# Patient Record
Sex: Female | Born: 1940 | Race: White | Hispanic: No | Marital: Married | Smoking: Former smoker
Health system: Southern US, Community
[De-identification: ages and names within clinical notes are randomized; demographics above are authoritative.]

## PROBLEM LIST (undated history)

## (undated) DIAGNOSIS — G5600 Carpal tunnel syndrome, unspecified upper limb: Secondary | ICD-10-CM

## (undated) DIAGNOSIS — G1221 Amyotrophic lateral sclerosis: Secondary | ICD-10-CM

## (undated) DIAGNOSIS — E039 Hypothyroidism, unspecified: Secondary | ICD-10-CM

## (undated) HISTORY — PX: OTHER SURGICAL HISTORY: SHX169

## (undated) HISTORY — DX: Carpal tunnel syndrome, unspecified upper limb: G56.00

## (undated) HISTORY — PX: TONSILLECTOMY: SUR1361

## (undated) HISTORY — DX: Hypothyroidism, unspecified: E03.9

---

## 1993-10-23 HISTORY — PX: APPENDECTOMY: SHX54

## 2019-10-24 HISTORY — PX: CARPAL TUNNEL RELEASE: SHX101

## 2019-10-24 HISTORY — PX: ELBOW SURGERY: SHX618

## 2021-02-24 ENCOUNTER — Other Ambulatory Visit: Payer: Self-pay

## 2021-02-24 ENCOUNTER — Telehealth: Payer: Self-pay | Admitting: Neurology

## 2021-02-24 ENCOUNTER — Encounter: Payer: Self-pay | Admitting: *Deleted

## 2021-02-24 ENCOUNTER — Ambulatory Visit (INDEPENDENT_AMBULATORY_CARE_PROVIDER_SITE_OTHER): Payer: Medicare Other | Admitting: Neurology

## 2021-02-24 ENCOUNTER — Encounter: Payer: Self-pay | Admitting: Neurology

## 2021-02-24 VITALS — BP 154/69 | HR 62 | Ht 61.5 in | Wt 153.8 lb

## 2021-02-24 DIAGNOSIS — G1220 Motor neuron disease, unspecified: Secondary | ICD-10-CM

## 2021-02-24 DIAGNOSIS — M62521 Muscle wasting and atrophy, not elsewhere classified, right upper arm: Secondary | ICD-10-CM

## 2021-02-24 DIAGNOSIS — R29898 Other symptoms and signs involving the musculoskeletal system: Secondary | ICD-10-CM | POA: Diagnosis not present

## 2021-02-24 DIAGNOSIS — E538 Deficiency of other specified B group vitamins: Secondary | ICD-10-CM

## 2021-02-24 NOTE — Progress Notes (Addendum)
[facility]    Provider:  Dr Jaynee Potts Requesting Provider: Erline Levine, MD Primary Care Provider:  Pcp, No  CC:  Right arm weakness  HPI:  Alexandria Potts is a 80 y.o. female here as requested by Alexandria Levine, MD for right arm weakness.  I reviewed Alexandria Potts notes: She is retired female who was referred for neurosurgical evaluation by her physical therapist Alexandria Potts due to complaints of progressively worsening right arm and hand weakness.  The patient stated "I cannot use my right hand and cannot pull my pantyhose on" the patient reported that she underwent a right ulnar and right median nerve decompression in January 2021 by Alexandria Potts at [facility].  Her symptoms continue to persist after undergoing decompression of the nerves.  At the 80-monthpostop visit she was referred to Dr. BRolena Infantefor evaluation of her cervical spine due to continued right hand weakness.  She was then sent for nerve conduction EMG and referred to physical therapy.  She reports that the nerve conduction test was unremarkable.  While undergoing physical therapy, the physical therapist suggested the patient be evaluated by Dr. SVertell Limberand that physical therapy would not be beneficial to her at this time.  She has also undergone a recent MRI of her right shoulder which was unremarkable.  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  However unusual that patient does not have pain, but her symptoms continue to progressively worsen, profound weakness on examination, imaging does not correlate with her symptoms and examination, Alexandria Potts her evaluated before considering any surgical procedure.  On examination  sensation was normal, gait and station appeared normal, her strength was intact other than 4 out of 5 weakness throughout the right arm deltoid biceps and triceps.  With increased DTRs bilaterally in both extremities, normal sensation C2-T1, cranial nerves normal, she did have a Hoffmann's on the right.  Started about 2 years ago. in January of 2020 - 2021 she went to [facility] and she was diagnosed with CTS and ulnar neuropathy. She Potts Dr. GAmedeo Plentyperform release surgery. But she did not improve after that. She Potts xrays/imaging of the arm. She was sent to Alexandria Potts repeat emg/ncs and MRI of the shoulder and MRI of the cervical spine. She is very active. She painted 2 rocking chairs with her left hand yesterday. She reports some "drawing" of her toes and some numbness and tingling in the toes. No sensory changes in the arm or hand. She has noticed wasting of the muscels of the right arm. Nothing on the left arm or legs. She has Potts some "catches" in her neck. Progressive weakness of the right arm and hand. She can't make a fist. She can;t make an O with hier forefinger and thumb. Actually worsened after the CTS and ulnar release surgery. No facial weakness, no swallowing problems, no tremors, Reviewed notes, labs and imaging from outside physicians, which showed: see above  Review of Systems: Patient complains of symptoms per HPI as well as the following symptoms weakness. Pertinent negatives and positives per HPI. All others negative.   Social History   Socioeconomic History  . Marital status: Married    Spouse name: Not on file  .  Number of children: Not on file  . Years of education: Not on file  . Highest education level: Not on file  Occupational History  . Not on file  Tobacco Use  . Smoking status: Former Smoker    Packs/day: 0.50    Years: 3.00    Pack years: 1.50  . Smokeless tobacco: Never Used  . Tobacco comment: in her early 68s  Vaping Use  . Vaping Use: Never  used  Substance and Sexual Activity  . Alcohol use: Never  . Drug use: Never  . Sexual activity: Not on file  Other Topics Concern  . Not on file  Social History Narrative   Lives at home with husband    Right handed   Caffeine: 1 cup/day   Social Determinants of Health   Financial Resource Strain: Not on file  Food Insecurity: Not on file  Transportation Needs: Not on file  Physical Activity: Not on file  Stress: Not on file  Social Connections: Not on file  Intimate Partner Violence: Not on file    Family History  Problem Relation Age of Onset  . Heart Problems Mother   . Other Mother        "lung problems"  . Heart Problems Father   . Other Father        "lung problems"  . Diabetes Other        both sides     Past Medical History:  Diagnosis Date  . Carpal tunnel syndrome    right  . Hypothyroidism     Patient Active Problem List   Diagnosis Date Noted  . Right arm weakness 03/08/2021    Past Surgical History:  Procedure Laterality Date  . APPENDECTOMY  1995  . CARPAL TUNNEL RELEASE Right 10/2019  . ELBOW SURGERY Right 10/2019  . HYSTERECTOMY    . TONSILLECTOMY     as a child    Current Outpatient Medications  Medication Sig Dispense Refill  . amLODipine (NORVASC) 5 MG tablet Take by mouth.    . fluticasone (FLONASE) 50 MCG/ACT nasal spray fluticasone propionate 50 mcg/actuation nasal spray,suspension    . latanoprost (XALATAN) 0.005 % ophthalmic solution     . levothyroxine (SYNTHROID) 75 MCG tablet levothyroxine 75 mcg tablet    . pioglitazone (ACTOS) 15 MG tablet Take 15 mg by mouth daily.     No current facility-administered medications for this visit.    Allergies as of 02/24/2021 - Review Complete 02/24/2021  Allergen Reaction Noted  . Penicillins  01/03/2021  . Statins  02/24/2021   Wasting of the interossei most pronounced in the right FDI. No facial weakness.   Vitals: BP (!) 154/69 (BP Location: Left Arm, Patient Position:  Sitting)   Pulse 62   Ht 5' 1.5" (1.562 m)   Wt 153 lb 12.8 oz (69.8 kg)   BMI 28.59 kg/m  Last Weight:  Wt Readings from Last 1 Encounters:  02/24/21 153 lb 12.8 oz (69.8 kg)   Last Height:   Ht Readings from Last 1 Encounters:  02/24/21 5' 1.5" (1.562 m)     Physical exam: Exam: Gen: NAD, conversant, well nourised,  well groomed                     CV: RRR, no MRG. No Carotid Bruits. No peripheral edema, warm, nontender Eyes: Conjunctivae clear without exudates or hemorrhage  Neuro: Detailed Neurologic Exam  Speech:    Speech is normal; fluent and spontaneous with  normal comprehension.  Cognition:    The patient is oriented to person, place, and time;     recent and remote memory intact;     language fluent;     normal attention, concentration,     fund of knowledge Cranial Nerves:    The pupils are equal, round, and reactive to light. Pupils too small to visualize fundi. Visual fields are full to finger confrontation. Extraocular movements are intact. Trigeminal sensation is intact and the muscles of mastication are normal. The face is symmetric. The palate elevates in the midline. Hearing intact. Voice is normal. Shoulder shrug is normal. The tongue has normal motion without fasciculations.   Coordination:    No dysmetria or ataxia noted on ftn/hts (cannt perform with right arm due to weakness)  Gait:   normal.   Motor Observation and strength  Right arm atrophy of the proximal and distal muscles biceps, triceps,FDI;  supraspinatus and infraspinatus appear not to be atrophied and there is no suprascapular winging, less so atrophy in the forearm.   Right biceps 1/5 Right bicep 3/5 Right tricep 3/5 Right Pronator/supinator 2+-3/5 (pronator slightly weaker) Right Wrist flexor more intact 4/5 but interossei 2/5, opponens 3/5 Right Wrist extensor 1/5 Right hand weakness predominantly ulnar (medial lumbricals) with a benediction sign left hand   lelft deltoid:  4/5 Left biceps 4+/5  Left triceps 4+/5 Left Pronator 4+/5 Left Supinator 3+-4/5 Left wrist extensor 2+/5 but flexor appears intact Slight weakness left opponens and left interossei       No asymmetry, no atrophy, and no involuntary movements noted. Tone:    Decreased right arm  Posture:    Posture is normal. normal erect       Sensation: intact to LT     Reflex Exam:  DTR's:  1+ bilateral Ajs,  3+ right patellar, 3+ left patellar,  left arm 2+-3+ Reflexes reduced right arm right trace triceps, right trace biceps, right trace to 1+ brachio    Toes:    The toes are downgoing bilaterally.   Clonus:    Clonus is absent.    Assessment/Plan:   80 y.o. female here as requested by Alexandria Levine, MD for right arm weakness. Ongoing or 5 years per patient s/p ulnar transposition and carpal tunnel release.  MRI of her right shoulder was completed without etiology.  Cervical MRI showed degenerative and arthritic changes multilevel on the right at C6-C7, bilaterally at the C5-C6 level, bilaterally at the C4-C5 level.  This is a patient with progressive right arm weakness.  She has proximal and distal atrophy of the right arm and weakness throughout which is more extensive than foraminal stenoses seen on her MRI cervical spine.  It is very unusual that she does not have any pain and that would raise a red flag for possible motor neuron disease. Some features support cervical radiculopathy as opposed to motor neuron disease including cervical multilevel arthritic and degenerative changes causing multilevel foraminal stenosis, ongoing for 2 years per patient,no significant fasciculations,  reflexes are reduced in the right arm(She does have hyperreflexia in the other limbs however). She does have mild weakness in the left arm however no atrophy, not symptomatic in the left arm, and strength in her lower extremities are normal; You would expect after 2+ years, if this were motor neuron disease,  patient would have far more symptoms.  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  Very unusual presentation.  No bulbar symptoms.   Emg/ncs: will repeat and compare to prior exams, 3 limbs (bilateral uppers and one lower) MRI of the brain and repeat MRI of the cervical spine both w/wo contrast: see results, do not explain the extent of her symptoms Several labs today including CK May need a second opinion from one of my colleagues Alexandria Potts or Dr. Krista Blue or referal to [facility] This Encounter  Procedures  . MR BRAIN W WO CONTRAST  . MR CERVICAL SPINE W WO CONTRAST  . CBC with Differential/Platelets  . Comprehensive metabolic panel  . TSH  . CK  . B12 and Folate Panel  . Methylmalonic acid, serum  . Magnesium  . Sedimentation rate  . Multiple Myeloma Panel (SPEP&IFE w/QIG)  . NCV with EMG(electromyography)   No orders of the defined types were placed in this encounter.   Cc: Alexandria Levine, MD,  Pcp, No  Sarina Ill, MD  [facility] [address] [place], [place] [address]  Phone [phone] Fax [phone]  I spent over 70 minutes of face-to-face and non-face-to-face time with patient on the  1. Right arm weakness   2. Atrophy of muscle of right upper arm   3. 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  This included previsit chart review, lab review, study review, order entry, electronic health record documentation, patient education on the different diagnostic and therapeutic options, counseling and coordination of care, risks and benefits of  management, compliance, or risk factor reduction

## 2021-02-24 NOTE — Patient Instructions (Signed)
MRI of the brain EMG/NCS

## 2021-02-24 NOTE — Telephone Encounter (Signed)
Spoke with patient who states she rescheduled her MRIs for 5/23 at 9am.

## 2021-02-24 NOTE — Telephone Encounter (Signed)
I called [facility] scheduling @ [phone]. Patient is scheduled for both MRI's (brain & cervical spine) for 4pm tomorrow at [facility]. Patient is to arrive to the admitting department by 3:30.

## 2021-02-24 NOTE — Telephone Encounter (Signed)
I called patient. Patient states that she is [place] way to the beach and will not be able to make the 4:00 appointment tomorrow. I provided her with the number to reschedule.

## 2021-02-25 ENCOUNTER — Ambulatory Visit (HOSPITAL_COMMUNITY): Payer: Medicare Other

## 2021-03-01 LAB — CBC WITH DIFFERENTIAL/PLATELET
Basophils Absolute: 0.1 10*3/uL (ref 0.0–0.2)
Basos: 1 %
EOS (ABSOLUTE): 0.2 10*3/uL (ref 0.0–0.4)
Eos: 5 %
Hematocrit: 39.8 % (ref 34.0–46.6)
Hemoglobin: 13.1 g/dL (ref 11.1–15.9)
Immature Grans (Abs): 0 10*3/uL (ref 0.0–0.1)
Immature Granulocytes: 0 %
Lymphocytes Absolute: 1.6 10*3/uL (ref 0.7–3.1)
Lymphs: 33 %
MCH: 29.9 pg (ref 26.6–33.0)
MCHC: 32.9 g/dL (ref 31.5–35.7)
MCV: 91 fL (ref 79–97)
Monocytes Absolute: 0.4 10*3/uL (ref 0.1–0.9)
Monocytes: 9 %
Neutrophils Absolute: 2.6 10*3/uL (ref 1.4–7.0)
Neutrophils: 52 %
Platelets: 183 10*3/uL (ref 150–450)
RBC: 4.38 x10E6/uL (ref 3.77–5.28)
RDW: 13.1 % (ref 11.7–15.4)
WBC: 5 10*3/uL (ref 3.4–10.8)

## 2021-03-01 LAB — MULTIPLE MYELOMA PANEL, SERUM
Albumin SerPl Elph-Mcnc: 3.9 g/dL (ref 2.9–4.4)
Albumin/Glob SerPl: 1.3 (ref 0.7–1.7)
Alpha 1: 0.2 g/dL (ref 0.0–0.4)
Alpha2 Glob SerPl Elph-Mcnc: 0.9 g/dL (ref 0.4–1.0)
B-Globulin SerPl Elph-Mcnc: 1 g/dL (ref 0.7–1.3)
Gamma Glob SerPl Elph-Mcnc: 1.1 g/dL (ref 0.4–1.8)
Globulin, Total: 3.2 g/dL (ref 2.2–3.9)
IgA/Immunoglobulin A, Serum: 208 mg/dL (ref 64–422)
IgG (Immunoglobin G), Serum: 1161 mg/dL (ref 586–1602)
IgM (Immunoglobulin M), Srm: 49 mg/dL (ref 26–217)

## 2021-03-01 LAB — COMPREHENSIVE METABOLIC PANEL
ALT: 30 IU/L (ref 0–32)
AST: 25 IU/L (ref 0–40)
Albumin/Globulin Ratio: 1.8 (ref 1.2–2.2)
Albumin: 4.6 g/dL (ref 3.7–4.7)
Alkaline Phosphatase: 56 IU/L (ref 44–121)
BUN/Creatinine Ratio: 19 (ref 12–28)
BUN: 14 mg/dL (ref 8–27)
Bilirubin Total: 0.5 mg/dL (ref 0.0–1.2)
CO2: 24 mmol/L (ref 20–29)
Calcium: 9.6 mg/dL (ref 8.7–10.3)
Chloride: 98 mmol/L (ref 96–106)
Creatinine, Ser: 0.74 mg/dL (ref 0.57–1.00)
Globulin, Total: 2.5 g/dL (ref 1.5–4.5)
Glucose: 99 mg/dL (ref 65–99)
Potassium: 4.4 mmol/L (ref 3.5–5.2)
Sodium: 142 mmol/L (ref 134–144)
Total Protein: 7.1 g/dL (ref 6.0–8.5)
eGFR: 82 mL/min/{1.73_m2} (ref >59)

## 2021-03-01 LAB — TSH: TSH: 3.67 u[IU]/mL (ref 0.450–4.500)

## 2021-03-01 LAB — METHYLMALONIC ACID, SERUM: Methylmalonic Acid: 119 nmol/L (ref 0–378)

## 2021-03-01 LAB — MAGNESIUM: Magnesium: 2.2 mg/dL (ref 1.6–2.3)

## 2021-03-01 LAB — SEDIMENTATION RATE: Sed Rate: 9 mm/hr (ref 0–40)

## 2021-03-01 LAB — B12 AND FOLATE PANEL
Folate: 8.3 ng/mL (ref >3.0)
Vitamin B-12: 1632 pg/mL — ABNORMAL HIGH (ref 232–1245)

## 2021-03-01 LAB — CK: Total CK: 267 U/L — ABNORMAL HIGH (ref 32–182)

## 2021-03-03 ENCOUNTER — Telehealth: Payer: Self-pay | Admitting: *Deleted

## 2021-03-03 NOTE — Telephone Encounter (Signed)
Called pt and LVM asking for call back. When she calls back, please let her know her labs are normal except for slightly increased muscle enzymes from breakdown of muscles in her arms, nothing concerning and Dr Lucia Gaskins can review the results at her appointment next Monday May 16th.

## 2021-03-03 NOTE — Telephone Encounter (Signed)
-----   Message from Anson Fret, MD sent at 03/01/2021  8:47 AM EDT ----- Labs are normal except for slightly increased muscle enzymes from breakdown of muscles in her arms, nothing concerning and I can review at appoitnment Monday. thanks

## 2021-03-07 ENCOUNTER — Ambulatory Visit (INDEPENDENT_AMBULATORY_CARE_PROVIDER_SITE_OTHER): Payer: Medicare Other | Admitting: Neurology

## 2021-03-07 DIAGNOSIS — G1220 Motor neuron disease, unspecified: Secondary | ICD-10-CM

## 2021-03-07 DIAGNOSIS — Z0289 Encounter for other administrative examinations: Secondary | ICD-10-CM

## 2021-03-07 DIAGNOSIS — R29898 Other symptoms and signs involving the musculoskeletal system: Secondary | ICD-10-CM | POA: Diagnosis not present

## 2021-03-07 DIAGNOSIS — M62521 Muscle wasting and atrophy, not elsewhere classified, right upper arm: Secondary | ICD-10-CM

## 2021-03-07 NOTE — Telephone Encounter (Signed)
Discussed lab results with patient today while she was in the office.  Her questions were answered and she verbalized appreciation.

## 2021-03-08 DIAGNOSIS — R29898 Other symptoms and signs involving the musculoskeletal system: Secondary | ICD-10-CM | POA: Insufficient documentation

## 2021-03-08 NOTE — Progress Notes (Signed)
Right arm weakness see procedure note

## 2021-03-10 ENCOUNTER — Encounter: Payer: Medicare Other | Admitting: Neurology

## 2021-03-10 NOTE — Progress Notes (Addendum)
Full Name: Alexandria Potts Gender: Female MRN #: [id number] Date of Birth: 10-Sep-1941    Visit Date: 03/07/2021 14:44 Age: 80 Years Examining Physician: Naomie Dean, MD  Referring Physician: Maeola Harman, MD  History: Right arm weakness and atrophy  Summary: The right median motor nerve showed reduced amplitude (1.1 mV, normal greater than 4).  The right ulnar motor nerve showed reduced amplitude (1 mV, normal greater than 6).  The right median F wave showed delayed latency (42.8 ms, normal less than 31).  The right ulnar F wave showed delayed latency (64.7 ms, normal less than 32).  All remaining nerves (as indicated in the following tables) were within normal limits.  The right deltoid showed increased spontaneous activity, increased motor unit amplitude, prolonged motor unit duration, polyphasic motor units and diminished motor unit recruitment. the right biceps brachii showed increased spontaneous activity, prolonged motor unit duration, polyphasic motor units and diminished motor unit recruitment.  The right triceps showed increased spontaneous activity, prolonged motor unit duration, polyphasic motor units and diminished motor unit recruitment.  The right pronator teres showed increased spontaneous activity, prolonged motor unit duration, polyphasic motor units and diminished motor unit recruitment pattern.  The right brachioradialis showed increased spontaneous activity, increased motor unit amplitude, diminished motor unit recruitment.  The right cervical paraspinals were difficult to assess due to patient discomfort however it does appear that spontaneous activity was present.  The right extensor indices proprius showed increased spontaneous activity, increased motor unit amplitude and diminished motor unit recruitment, the right extensor digitorum communis and right first dorsal interosseous showed increased spontaneous activity but motor units were not elicited.  The right opponens  pollicis showed increased spontaneous activity, increased motor unit amplitude and diminished motor unit recruitment.  The left triceps showed diminished motor unit recruitment. All remaining muscles (as indicated in the following tables) were within normal limits.    Conclusion: EMG reveals diffuse acute/ongoing denervation in all tested muscles of the right upper extremity associated with chronic neurogenic changes, neurogenic firing patterns.  No fasciculations were seen on EMG but do appear clinically on exam. EMG needle study of the left upper extremity and right lower extremity were essentially normal with only minimal chronic neurogenic changes seen in the left triceps. Sensory conductions were normal in all limbs. No conduction block to suggest a demyelinating motor neuropathy. No evidence of myopathy or myositis.The right cervical paraspinals were difficult to assess due to patient discomfort however it does appear that spontaneous activity was present. Cannot rule out motor neuron disease at this time.    Naomie Dean, M.D.  [facility] [address] [place], [place] [address] Tel: [phone] Fax: [phone]  Verbal informed consent was obtained from the patient, patient was informed of potential risk of procedure, including bruising, bleeding, hematoma formation, infection, muscle weakness, muscle pain, numbness, among others.        MNC    Nerve / Sites Muscle Latency Ref. Amplitude Ref. Rel Amp Segments Distance Velocity Ref. Area    ms ms mV mV %  cm m/s m/s mVms  R Median - APB     Wrist APB 5.1 ?4.4 1.1 ?4.0 100 Wrist - APB 7   3.8     Upper arm APB 8.8  0.8  72.7 Upper arm - Wrist 19 52 ?49 2.6  L Median - APB     Wrist APB 4.1 ?4.4 4.1 ?4.0 100 Wrist - APB 7   13.5  Upper arm APB 7.7  3.1  75.4 Upper arm - Wrist 18 50 ?49 9.7  R Ulnar - ADM     Wrist ADM 4.1 ?3.3 1.0 ?6.0 100 Wrist - ADM 7   1.5     B.Elbow ADM 7.3  2.1  213 B.Elbow -  Wrist 17 54 ?49 4.5     A.Elbow ADM 9.1  1.5  73.9 A.Elbow - B.Elbow 10 54 ?49 4.1     Median Wrist ADM 4.2  0.9  58.8 Median Wrist - ADM    2.0  L Ulnar - ADM     Wrist ADM 3.0 ?3.3 8.3 ?6.0 100 Wrist - ADM 7   20.0     B.Elbow ADM 5.9  8.3  99.7 B.Elbow - Wrist 18 64 ?49 18.4     A.Elbow ADM 7.5  8.0  96.4 A.Elbow - B.Elbow 10 62 ?49 18.2             SNC    Nerve / Sites Rec. Site Peak Lat Ref.  Amp Ref. Segments Distance Peak Diff Ref.    ms ms V V  cm ms ms  R Radial - Anatomical snuff box (Forearm)     Forearm Wrist 2.8 ?2.9 21 ?15 Forearm - Wrist 10    R Median, Ulnar - Transcarpal comparison     Median Palm Wrist 2.6 ?2.2 57 ?35 Median Palm - Wrist 8       Ulnar Palm Wrist 2.3 ?2.2 23 ?12 Ulnar Palm - Wrist 8          Median Palm - Ulnar Palm  0.3 ?0.4  L Median, Ulnar - Transcarpal comparison     Median Palm Wrist 2.4 ?2.2 55 ?35 Median Palm - Wrist 8       Ulnar Palm Wrist 2.2 ?2.2 29 ?12 Ulnar Palm - Wrist 8          Median Palm - Ulnar Palm  0.2 ?0.4  R Median - Orthodromic (Dig II, Mid palm)     Dig II Wrist 3.4 ?3.4 16 ?10 Dig II - Wrist 13    L Median - Orthodromic (Dig II, Mid palm)     Dig II Wrist 3.3 ?3.4 16 ?10 Dig II - Wrist 13    R Ulnar - Orthodromic, (Dig V, Mid palm)     Dig V Wrist 3.0 ?3.1 13 ?5 Dig V - Wrist 11    L Ulnar - Orthodromic, (Dig V, Mid palm)     Dig V Wrist 2.9 ?3.1 10 ?5 Dig V - Wrist 11    R Lateral antebrachial cutaneous - Forearm (Elbow)     Elbow Forearm 2.6 ?3.0 15 ?10 Elbow - Forearm 12    R Medial antebrachial cutaneous - Forearm (Elbow)     Elbow Forearm 2.5 ?3.2 12 ?5 Elbow - Forearm 12                         F  Wave    Nerve F Lat Ref.   ms ms  R Median - APB 42.8 ?31.0  R Ulnar - ADM 64.7 ?32.0  L Ulnar - ADM 27.7 ?32.0           EMG Summary Table    Spontaneous MUAP Recruitment  Muscle IA Fib PSW Fasc Other Amp Dur. Poly Pattern  R. Deltoid Normal 2+ 3+ None _______ Increased Normal 2+ Reduced  R. Biceps brachii  Normal None  4+ None _______ Normal Increased 3+ Reduced  R. Triceps brachii Normal None 3+ None _______ Normal Increased 3+ Reduced  R. Pronator teres Normal None 3+ None _______ Normal Increased 2+ Reduced  R. Brachioradialis Normal None 4+ None _______ Increased Normal Normal Reduced  R. Cervical paraspinals (low) Normal None +1 None _______ Normal Normal Normal Normal  R. Thoracic paraspinals (low) Normal None None None _______ Normal Normal Normal Normal  R. Extensor indicis proprius Normal None 2+ None _______ Increased Normal Normal Reduced  R. Extensor digitorum communis Normal None 4+ None _______      R. First dorsal interosseous Normal None 3+ None _______      R. Opponens pollicis Normal None 2+ None _______ Increased Normal Normal Reduced  R. Iliopsoas Normal None None None _______ Normal Normal Normal Normal  R. Vastus medialis Normal None None None _______ Normal Normal Normal Normal  R. Tibialis anterior Normal None None None _______ Normal Normal Normal Normal  R. Gastrocnemius (Medial head) Normal None None None _______ Normal Normal Normal Normal  R. Extensor hallucis longus Normal None None None _______ Normal Normal Normal Normal  L. Deltoid Normal None None None _______ Normal Normal Normal Normal  L. Triceps brachii Normal None None None _______ Normal Normal Normal Reduced  L. Pronator teres Normal None None None _______ Normal Normal Normal Normal  L. First dorsal interosseous Normal None None None _______ Normal Normal Normal Normal  L. Opponens pollicis Normal None None None _______ Normal Normal Normal Normal

## 2021-03-14 ENCOUNTER — Other Ambulatory Visit: Payer: Self-pay

## 2021-03-14 ENCOUNTER — Ambulatory Visit (HOSPITAL_COMMUNITY)
Admission: RE | Admit: 2021-03-14 | Discharge: 2021-03-14 | Disposition: A | Discharge status: Home / Self Care | Payer: Medicare Other | Source: Ambulatory Visit | Attending: Neurology | Admitting: Neurology

## 2021-03-14 DIAGNOSIS — R29898 Other symptoms and signs involving the musculoskeletal system: Secondary | ICD-10-CM

## 2021-03-14 DIAGNOSIS — G1220 Motor neuron disease, unspecified: Secondary | ICD-10-CM

## 2021-03-14 DIAGNOSIS — M62521 Muscle wasting and atrophy, not elsewhere classified, right upper arm: Secondary | ICD-10-CM | POA: Insufficient documentation

## 2021-03-14 IMAGING — MR MR HEAD WO/W CM
22 series · 48 of 48 positions shown · IV contrast (gadavist)
Comparison: MRI cervical spine (without report) [DATE].

CLINICAL DATA: Progressively worsening right arm and hand weakness.
Possible moderate neuron disease.

EXAM:
MRI HEAD WITHOUT AND WITH CONTRAST
MRI CERVICAL SPINE WITHOUT AND WITH CONTRAST
TECHNIQUE: Multiplanar, multiecho pulse sequences of the brain and surrounding
structures were obtained without and with intravenous contrast.
Multiplanar and multiecho pulse sequences of the cervical spine, to
include the craniocervical junction and cervicothoracic junction,
were obtained without and with intravenous contrast.
CONTRAST:  6mL GADAVIST GADOBUTROL 1 MMOL/ML IV SOLN

[Series 6: DWI · axial · 3.0mm · 1.36mm/px · z∈[-52,+96]mm · 6 of 104 slices shown (1 of 2)]
[im 1/104]
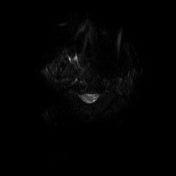
[im 21/104]
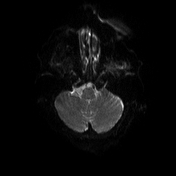
[im 42/104]
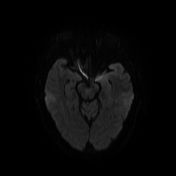
[im 62/104]
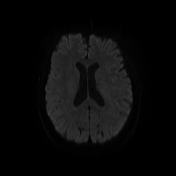
[im 83/104]
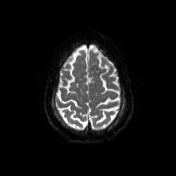
[im 104/104]
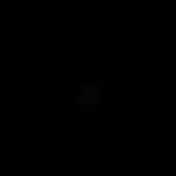

[Series 7: DWI · axial · 3.0mm · 1.36mm/px · z∈[-52,+96]mm · 3 of 52 slices shown (2 of 2)]
[im 1/52]
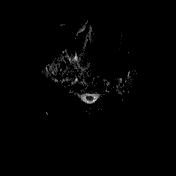
[im 26/52]
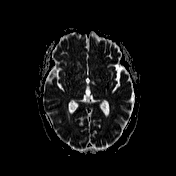
[im 52/52]
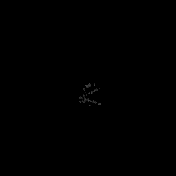

[Series 8: T1 · sagittal · 5.0mm · 0.75mm/px · 1 of 22 slices shown (1 of 4)]
[im 1/22]
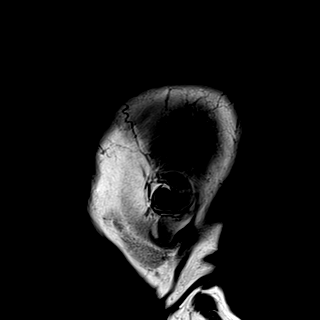

[Series 9: T2 · axial · 5.0mm · 0.62mm/px · 1 of 24 slices shown (1 of 3)]
[im 1/24]
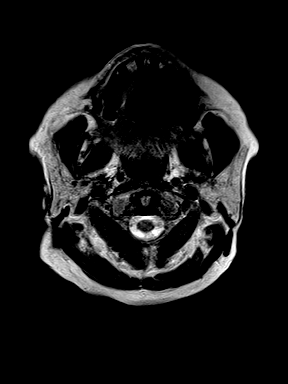

[Series 11: swi_images · axial · 3.0mm · 0.75mm/px · z∈[-51,+97]mm · 3 of 52 slices shown]
[im 1/52]
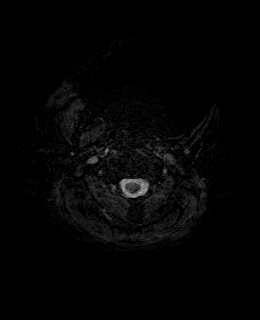
[im 26/52]
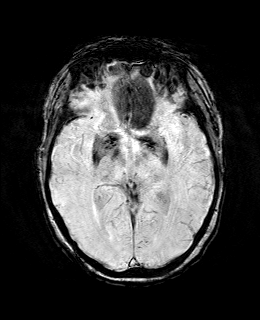
[im 52/52]
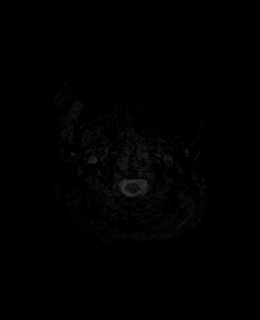

[Series 12: FLAIR · axial · 3.0mm · 0.75mm/px · z∈[-50,+98]mm · 3 of 52 slices shown]
[im 1/52]
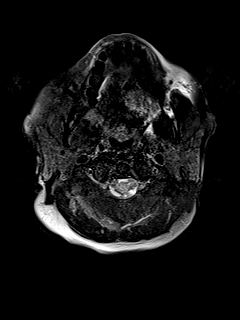
[im 26/52]
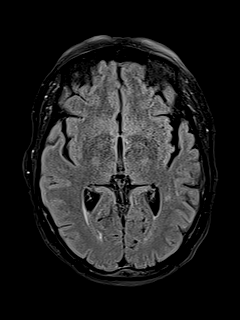
[im 52/52]
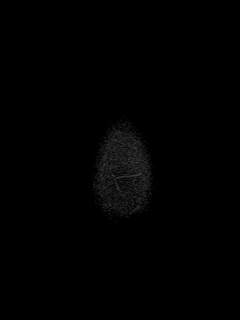

[Series 13: T1 · axial · 1.0mm · 0.94mm/px · z∈[-40,+98]mm · 7 of 144 slices shown (2 of 4)]
[im 1/144]
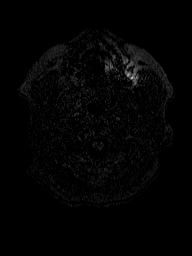
[im 24/144]
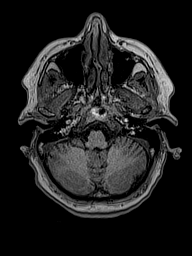
[im 48/144]
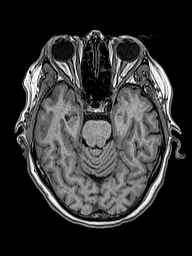
[im 72/144]
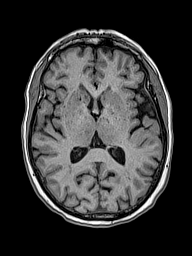
[im 96/144]
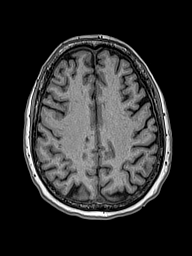
[im 120/144]
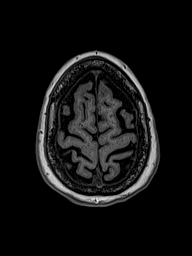
[im 144/144]
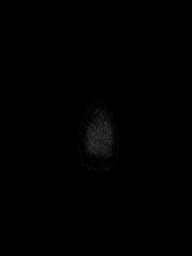

[Series 14: cor dwi_tracew · coronal · 5.0mm · 1.53mm/px · 3 of 50 slices shown]
[im 1/50]
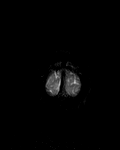
[im 25/50]
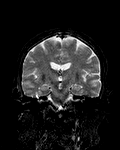
[im 50/50]
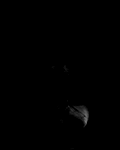

[Series 15: cor dwi_adc · coronal · 5.0mm · 1.53mm/px · 1 of 25 slices shown]
[im 1/25]
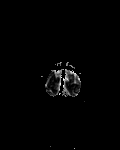

[Series 16: T2 · coronal · 5.0mm · 0.57mm/px · 2 of 32 slices shown (2 of 3)]
[im 1/32]
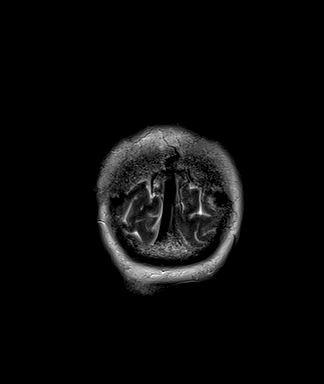
[im 32/32]
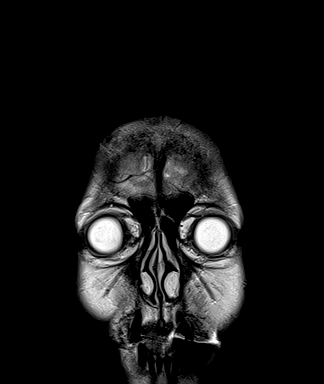

[Series 21: T1 · sagittal · 3.0mm · 0.69mm/px · 1 of 15 slices shown (3 of 4)]
[im 1/15]
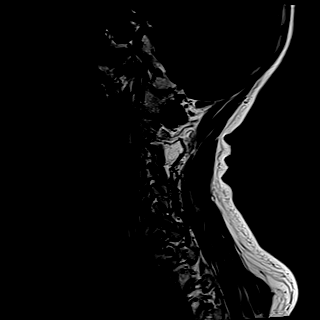

[Series 22: STIR · sagittal · 3.0mm · 0.86mm/px · 1 of 15 slices shown]
[im 1/15]
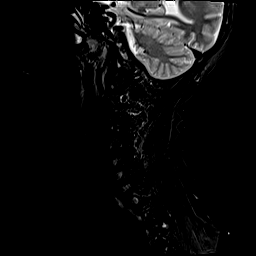

[Series 23: T2 · axial · 3.0mm · 0.70mm/px · 1 of 29 slices shown (3 of 3)]
[im 1/29]
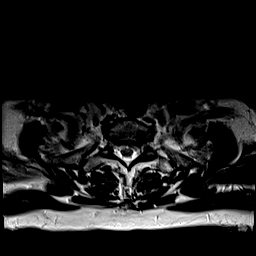

[Series 24: GRE · axial · 3.0mm · 0.47mm/px · 1 of 29 slices shown]
[im 1/29]
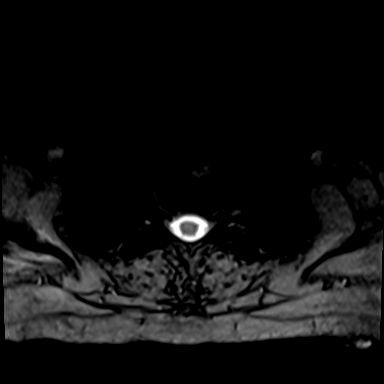

[Series 25: T1 · axial · 3.0mm · 0.35mm/px · 1 of 29 slices shown (4 of 4)]
[im 1/29]
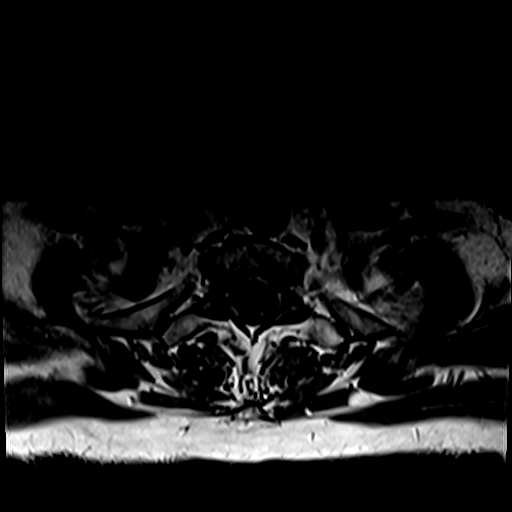

[Series 26: T2 post-contrast · sagittal · 3.0mm · 0.69mm/px · 1 of 15 slices shown]
[im 1/15]
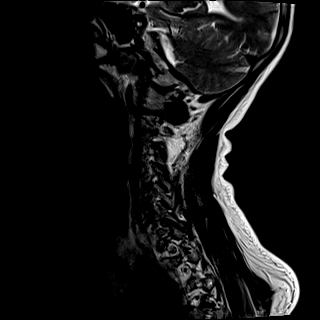

[Series 27: T1 fat-sat post-contrast · sagittal · 3.0mm · 0.69mm/px · 1 of 15 slices shown (1 of 2)]
[im 1/15]
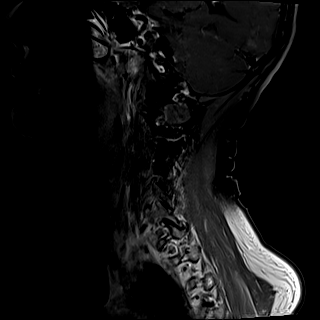

[Series 28: T1 post-contrast · axial · 3.0mm · 0.35mm/px · 1 of 29 slices shown (1 of 4)]
[im 1/29]
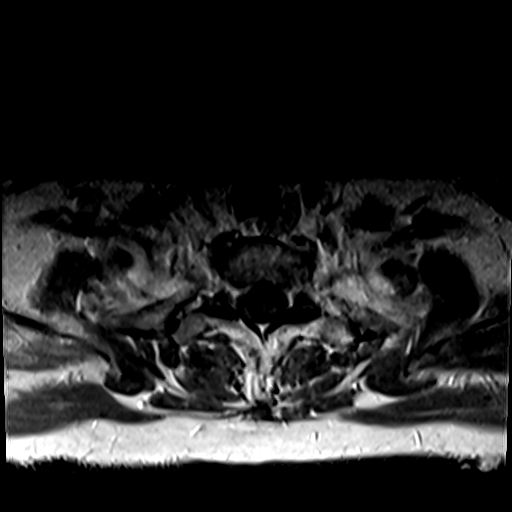

[Series 29: T1 fat-sat post-contrast · sagittal · 3.0mm · 0.69mm/px · 1 of 15 slices shown (2 of 2)]
[im 1/15]
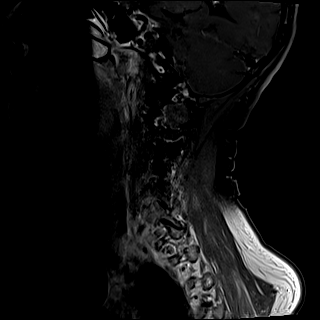

[Series 34: T1 post-contrast · axial · 1.0mm · 0.94mm/px · z∈[-68,+72]mm · 7 of 144 slices shown (2 of 4)]
[im 1/144]
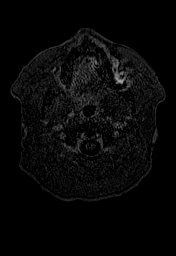
[im 24/144]
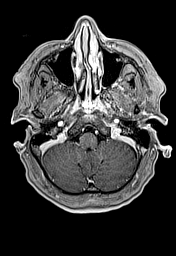
[im 48/144]
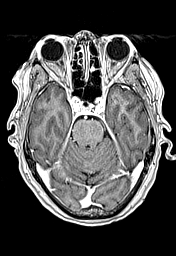
[im 72/144]
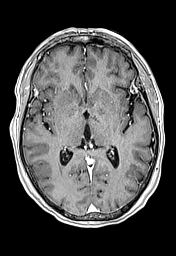
[im 96/144]
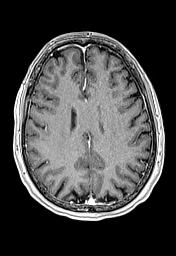
[im 120/144]
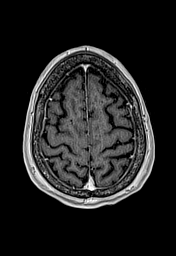
[im 144/144]
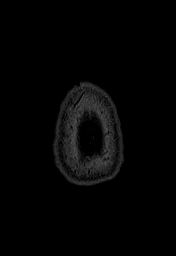

[Series 35: T1 post-contrast · coronal · 5.0mm · 0.43mm/px · 1 of 27 slices shown (3 of 4)]
[im 1/27]
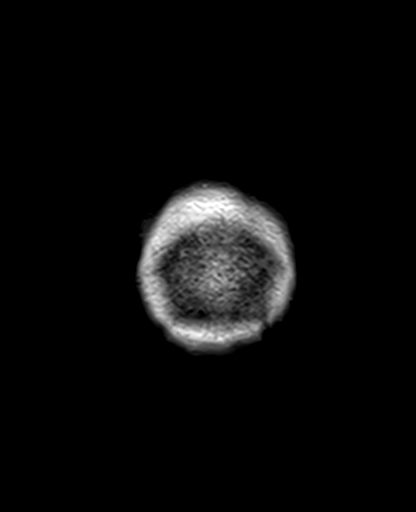

[Series 36: T1 post-contrast · sagittal · 5.0mm · 0.75mm/px · 1 of 24 slices shown (4 of 4)]
[im 1/24]
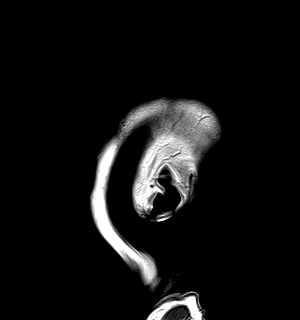

[48 of 48 positions shown; findings below may reference images not displayed]

FINDINGS: Study intermittently mildly limited by patient motion.

HEAD:

Brain: No acute infarction, acute hemorrhage, hydrocephalus,
extra-axial collection or mass lesion. Mild for age scattered
T2/FLAIR hyperintensities within the white matter and pons,
nonspecific but most likely related to chronic microvascular
ischemic disease. No abnormal T2/FLAIR or DWI signal involving the
corticospinal tracts. No abnormal enhancing

Vascular: Major arterial flow voids are maintained at the skull
base.

Skull and upper cervical spine: Normal marrow signal. Spine further
evaluated below.

Sinuses/Orbits: Clear sinuses.  Unremarkable orbits.

Other: No sizable mastoid effusions.

CERVICAL SPINE:

Mildly motion limited exam.  Within this limitation:

Alignment: Slight (1-2 mm) anterolisthesis C4 on C5 and
retrolisthesis of C5 on C6. Approximately 3 mm of anterolisthesis of
C7 on T1.

Vertebral: Vertebral body heights are maintained. No specific
evidence of acute fracture or discitis/osteomyelitis.

Cord: Normal cord signal.  No abnormal cord enhancement.

Vertebral arteries, paraspinal tissues: Visualized vertebral artery
flow voids are maintained. Unremarkable paraspinal soft tissues.

Disc levels:

C2-C3: No significant disc protrusion, foraminal stenosis, or canal
stenosis.

C3-C4: Mild right endplate spurring with right greater than left
facet and uncovertebral hypertrophy. Resulting moderate to severe
right and mild left foraminal stenosis.

C4-C5: Slight anterolisthesis of C4 on C5. Left eccentric lobulated
posterior disc osteophyte complex which contacts the ventral cord
and narrows the left root entry zone without significant canal
stenosis. Mild bilateral uncovertebral hypertrophy with mild
bilateral foraminal stenosis.

C5-C6: Slight retrolisthesis of C5 on C6. Posterior disc osteophyte
complex and ligamentum flavum thickening with resulting moderate
canal stenosis and moderate to severe bilateral foraminal stenosis.

C6-C7: Posterior disc osteophyte complex and bilateral uncovertebral
hypertrophy. Resulting mild to moderate canal stenosis and moderate
right greater than left foraminal stenosis.

C7-T1: 3 mm of anterolisthesis of C7 on T1. uncovering of the disc
and superimposed small broad disc bulge. Bilateral facet and
uncovertebral hypertrophy. No significant canal or foraminal
stenosis.
IMPRESSION: MRI head:

1. No evidence of acute abnormality.
2. Mild for age chronic microvascular ischemic disease.

MRI cervical spine:

1. Moderate to severe foraminal stenosis on the right at C3-C4 and
bilaterally at C5-C6. Moderate right greater than left foraminal
stenosis C6-C7.
2. Moderate canal stenosis at C5-C6 and mild to moderate canal
stenosis at C6-C7.

## 2021-03-14 IMAGING — MR MR CERVICAL SPINE WO/W CM
22 series · 48 of 48 positions shown · IV contrast (gadavist)
Comparison: MRI cervical spine (without report) [DATE].

CLINICAL DATA: Progressively worsening right arm and hand weakness.
Possible moderate neuron disease.

EXAM:
MRI HEAD WITHOUT AND WITH CONTRAST
MRI CERVICAL SPINE WITHOUT AND WITH CONTRAST
TECHNIQUE: Multiplanar, multiecho pulse sequences of the brain and surrounding
structures were obtained without and with intravenous contrast.
Multiplanar and multiecho pulse sequences of the cervical spine, to
include the craniocervical junction and cervicothoracic junction,
were obtained without and with intravenous contrast.
CONTRAST:  6mL GADAVIST GADOBUTROL 1 MMOL/ML IV SOLN

[Series 5: DWI · axial · 3.0mm · 1.36mm/px · z∈[-52,+96]mm · 6 of 104 slices shown (1 of 2)]
[im 1/104]
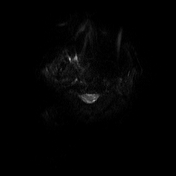
[im 21/104]
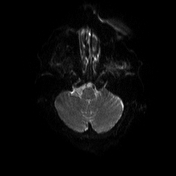
[im 42/104]
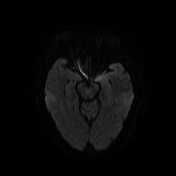
[im 62/104]
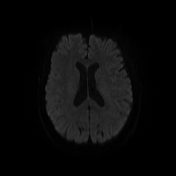
[im 83/104]
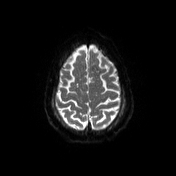
[im 104/104]
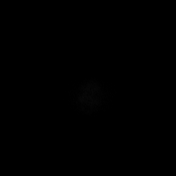

[Series 6: DWI · axial · 3.0mm · 1.36mm/px · z∈[-52,+96]mm · 3 of 52 slices shown (2 of 2)]
[im 1/52]
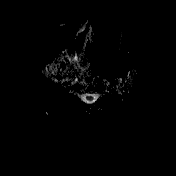
[im 26/52]
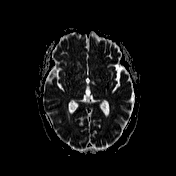
[im 52/52]
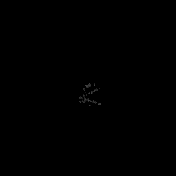

[Series 7: T1 · sagittal · 5.0mm · 0.75mm/px · 1 of 22 slices shown (1 of 4)]
[im 1/22]
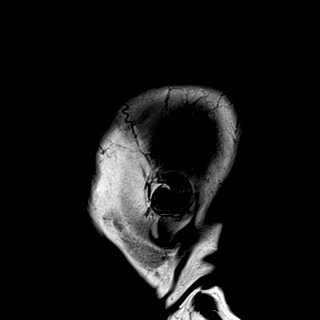

[Series 8: T2 · axial · 5.0mm · 0.62mm/px · 1 of 24 slices shown (1 of 3)]
[im 1/24]
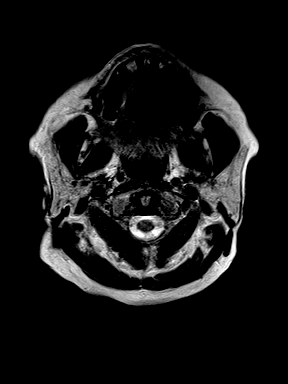

[Series 10: swi_images · axial · 3.0mm · 0.75mm/px · z∈[-51,+97]mm · 3 of 52 slices shown]
[im 1/52]
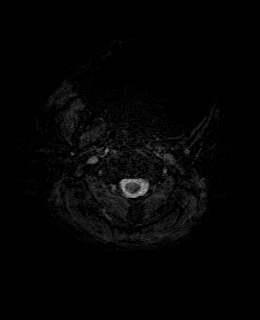
[im 26/52]
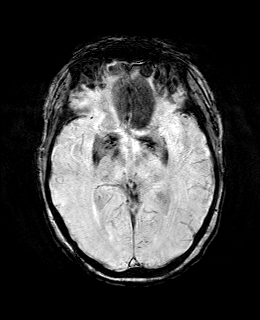
[im 52/52]
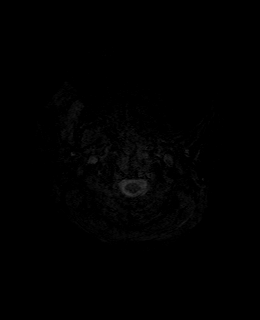

[Series 11: FLAIR · axial · 3.0mm · 0.75mm/px · z∈[-50,+98]mm · 3 of 52 slices shown]
[im 1/52]
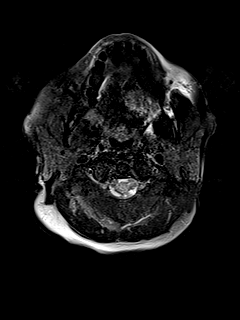
[im 26/52]
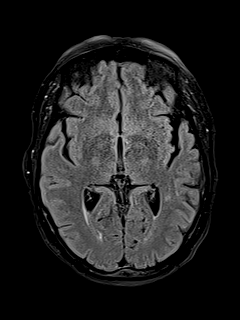
[im 52/52]
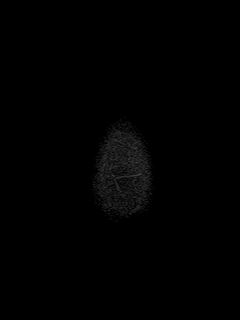

[Series 12: T1 · axial · 1.0mm · 0.94mm/px · z∈[-40,+98]mm · 7 of 144 slices shown (2 of 4)]
[im 1/144]
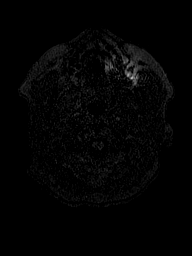
[im 24/144]
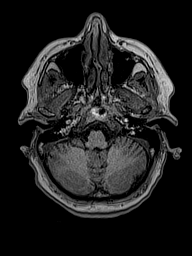
[im 48/144]
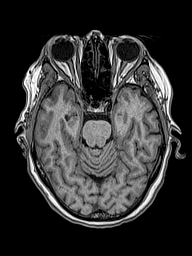
[im 72/144]
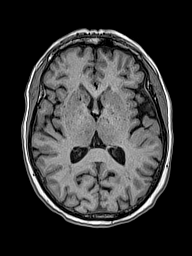
[im 96/144]
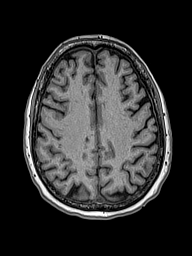
[im 120/144]
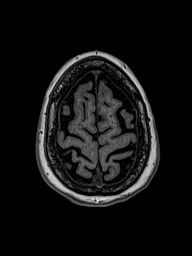
[im 144/144]
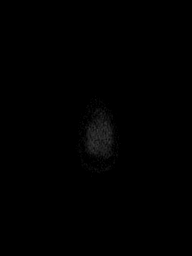

[Series 13: cor dwi_tracew · coronal · 5.0mm · 1.53mm/px · 3 of 50 slices shown]
[im 1/50]
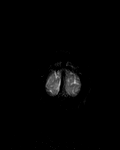
[im 25/50]
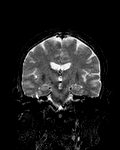
[im 50/50]
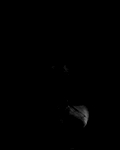

[Series 14: cor dwi_adc · coronal · 5.0mm · 1.53mm/px · 1 of 25 slices shown]
[im 1/25]
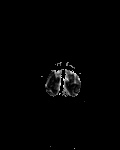

[Series 15: T2 · coronal · 5.0mm · 0.57mm/px · 2 of 32 slices shown (2 of 3)]
[im 1/32]
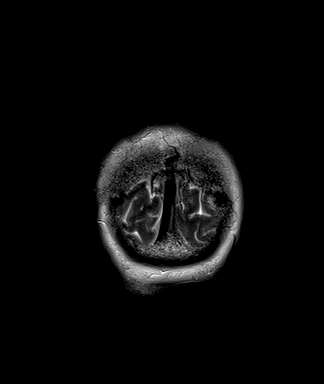
[im 32/32]
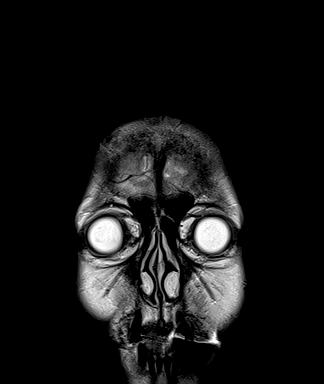

[Series 20: T1 · sagittal · 3.0mm · 0.69mm/px · 1 of 15 slices shown (3 of 4)]
[im 1/15]
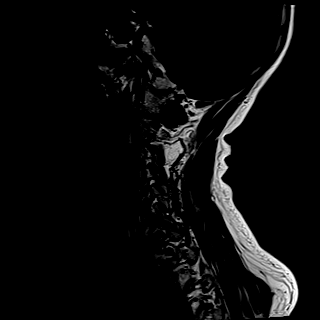

[Series 21: STIR · sagittal · 3.0mm · 0.86mm/px · 1 of 15 slices shown]
[im 1/15]
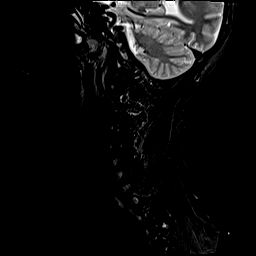

[Series 22: T2 · axial · 3.0mm · 0.70mm/px · 1 of 29 slices shown (3 of 3)]
[im 1/29]
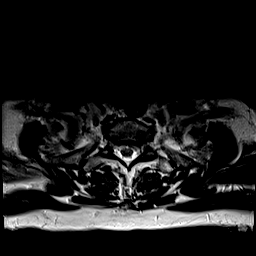

[Series 23: GRE · axial · 3.0mm · 0.47mm/px · 1 of 29 slices shown]
[im 1/29]
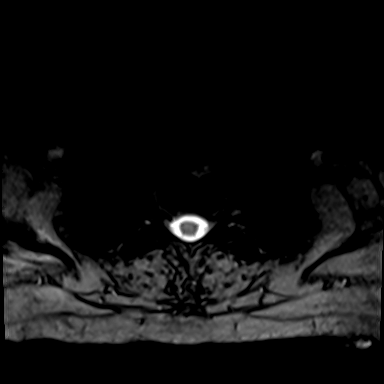

[Series 24: T1 · axial · 3.0mm · 0.35mm/px · 1 of 29 slices shown (4 of 4)]
[im 1/29]
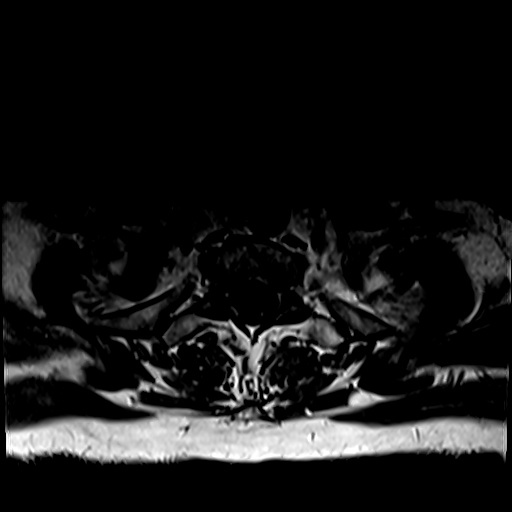

[Series 25: T2 post-contrast · sagittal · 3.0mm · 0.69mm/px · 1 of 15 slices shown]
[im 1/15]
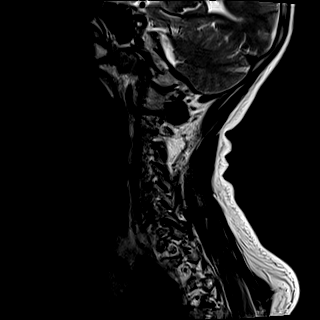

[Series 26: T1 fat-sat post-contrast · sagittal · 3.0mm · 0.69mm/px · 1 of 15 slices shown (1 of 2)]
[im 1/15]
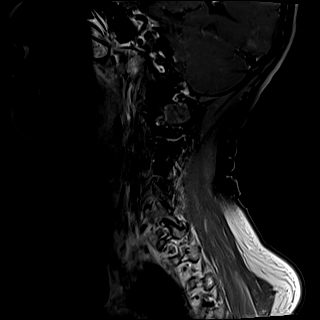

[Series 27: T1 post-contrast · axial · 3.0mm · 0.35mm/px · 1 of 29 slices shown (1 of 4)]
[im 1/29]
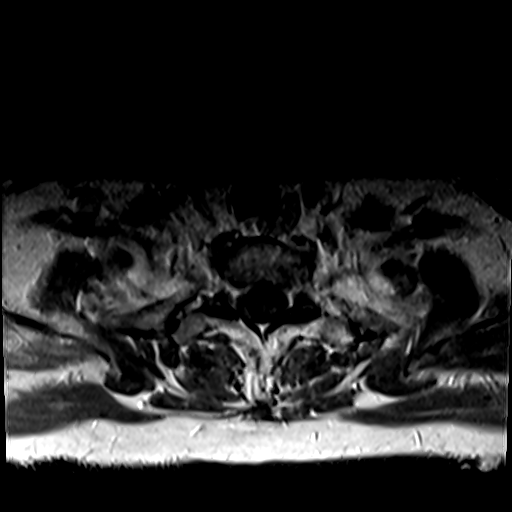

[Series 28: T1 fat-sat post-contrast · sagittal · 3.0mm · 0.69mm/px · 1 of 15 slices shown (2 of 2)]
[im 1/15]
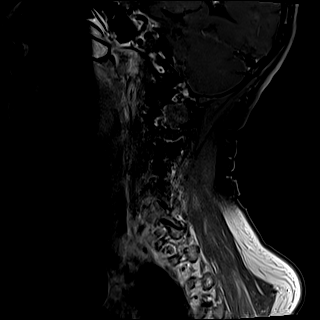

[Series 33: T1 post-contrast · axial · 1.0mm · 0.94mm/px · z∈[-68,+72]mm · 7 of 144 slices shown (2 of 4)]
[im 1/144]
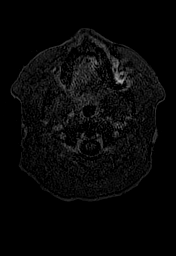
[im 24/144]
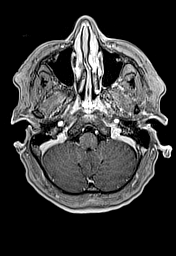
[im 48/144]
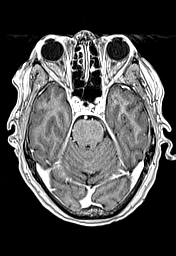
[im 72/144]
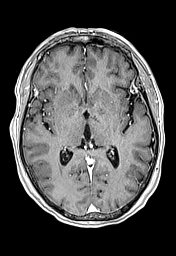
[im 96/144]
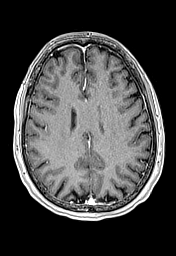
[im 120/144]
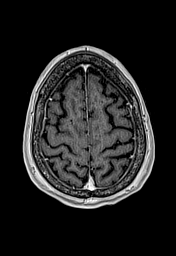
[im 144/144]
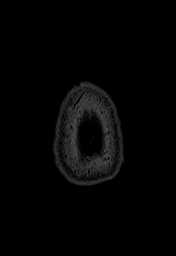

[Series 34: T1 post-contrast · coronal · 5.0mm · 0.43mm/px · 1 of 27 slices shown (3 of 4)]
[im 1/27]
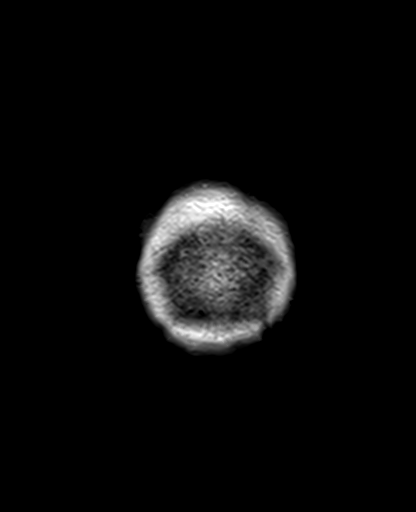

[Series 35: T1 post-contrast · sagittal · 5.0mm · 0.75mm/px · 1 of 24 slices shown (4 of 4)]
[im 1/24]
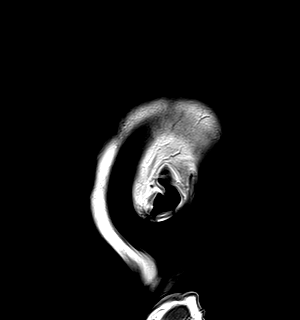

[48 of 48 positions shown; findings below may reference images not displayed]

FINDINGS: Study intermittently mildly limited by patient motion.

HEAD:

Brain: No acute infarction, acute hemorrhage, hydrocephalus,
extra-axial collection or mass lesion. Mild for age scattered
T2/FLAIR hyperintensities within the white matter and pons,
nonspecific but most likely related to chronic microvascular
ischemic disease. No abnormal T2/FLAIR or DWI signal involving the
corticospinal tracts. No abnormal enhancing

Vascular: Major arterial flow voids are maintained at the skull
base.

Skull and upper cervical spine: Normal marrow signal. Spine further
evaluated below.

Sinuses/Orbits: Clear sinuses.  Unremarkable orbits.

Other: No sizable mastoid effusions.

CERVICAL SPINE:

Mildly motion limited exam.  Within this limitation:

Alignment: Slight (1-2 mm) anterolisthesis C4 on C5 and
retrolisthesis of C5 on C6. Approximately 3 mm of anterolisthesis of
C7 on T1.

Vertebral: Vertebral body heights are maintained. No specific
evidence of acute fracture or discitis/osteomyelitis.

Cord: Normal cord signal.  No abnormal cord enhancement.

Vertebral arteries, paraspinal tissues: Visualized vertebral artery
flow voids are maintained. Unremarkable paraspinal soft tissues.

Disc levels:

C2-C3: No significant disc protrusion, foraminal stenosis, or canal
stenosis.

C3-C4: Mild right endplate spurring with right greater than left
facet and uncovertebral hypertrophy. Resulting moderate to severe
right and mild left foraminal stenosis.

C4-C5: Slight anterolisthesis of C4 on C5. Left eccentric lobulated
posterior disc osteophyte complex which contacts the ventral cord
and narrows the left root entry zone without significant canal
stenosis. Mild bilateral uncovertebral hypertrophy with mild
bilateral foraminal stenosis.

C5-C6: Slight retrolisthesis of C5 on C6. Posterior disc osteophyte
complex and ligamentum flavum thickening with resulting moderate
canal stenosis and moderate to severe bilateral foraminal stenosis.

C6-C7: Posterior disc osteophyte complex and bilateral uncovertebral
hypertrophy. Resulting mild to moderate canal stenosis and moderate
right greater than left foraminal stenosis.

C7-T1: 3 mm of anterolisthesis of C7 on T1. uncovering of the disc
and superimposed small broad disc bulge. Bilateral facet and
uncovertebral hypertrophy. No significant canal or foraminal
stenosis.
IMPRESSION: MRI head:

1. No evidence of acute abnormality.
2. Mild for age chronic microvascular ischemic disease.

MRI cervical spine:

1. Moderate to severe foraminal stenosis on the right at C3-C4 and
bilaterally at C5-C6. Moderate right greater than left foraminal
stenosis C6-C7.
2. Moderate canal stenosis at C5-C6 and mild to moderate canal
stenosis at C6-C7.

## 2021-03-14 MED ORDER — GADOBUTROL 1 MMOL/ML IV SOLN
6.0000 mL | Freq: Once | INTRAVENOUS | Status: AC | PRN
  Administered 2021-03-14: 6 mL via INTRAVENOUS

## 2021-03-17 ENCOUNTER — Ambulatory Visit (INDEPENDENT_AMBULATORY_CARE_PROVIDER_SITE_OTHER): Payer: Medicare Other | Admitting: Neurology

## 2021-03-17 ENCOUNTER — Encounter: Payer: Self-pay | Admitting: Neurology

## 2021-03-17 VITALS — BP 151/67 | HR 69 | Ht 61.0 in | Wt 154.4 lb

## 2021-03-17 DIAGNOSIS — M62521 Muscle wasting and atrophy, not elsewhere classified, right upper arm: Secondary | ICD-10-CM | POA: Diagnosis not present

## 2021-03-17 NOTE — Progress Notes (Addendum)
Reason for visit: Right arm weakness  Alexandria Potts is a 80 y.o. female  History of present illness:  Alexandria Potts is an 80 year old right-handed white female with a history of a slowly progressive painless weakness involving the right upper extremity.  The patient is not exactly clear when the symptoms started, she is fairly certain that in the summer of 2020 that she was normal in strength, she remembers that she was canning vegetables and had no indication that her arm was weak on either side.  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  She reported no numbness of either upper extremity.  She does not report any changes in speech or swallowing.  She denies any significant neck pain, but more recently she has noted occasional twinges of pain if she turns her head a certain way that occurs in the neck, without radiation down either arm.  She reports no weakness of the legs, she has not had any balance issues or any problems controlling the bowels or the bladder.  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  She has had some cramps involving the right foot mainly, this improved when she stopped her statin medication.  She has undergone MRI of the cervical spine that shows severe right and moderate to severe left C5-6 neuroforaminal narrowing, with possible impingement of the C6 nerve roots bilaterally.  There is moderate right and mild left C6-7 neuroforaminal narrowing and moderate to severe bilateral C3-4 neuroforaminal narrowing, there may be some potential impingement of the C4 nerve roots bilaterally.  There is no evidence of spinal cord injury or spinal cord compression.  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  EMG evaluation of the right leg and the left arm showed no evidence of neuropathic denervation.  The patient comes back here for another evaluation of her right arm weakness.  Past Medical History:  Diagnosis Date   Carpal tunnel syndrome    right   Hypothyroidism     Past Surgical History:  Procedure Laterality Date   APPENDECTOMY  1995   CARPAL TUNNEL RELEASE Right 10/2019   ELBOW SURGERY Right 10/2019   HYSTERECTOMY     TONSILLECTOMY     as a child    Family History  Problem Relation Age of Onset   Heart Problems Mother    Other Mother        "lung problems"   Heart Problems Father    Other Father        "lung problems"   Diabetes Other        both sides     Social history:  reports that she has quit smoking. She has a 1.50 pack-year smoking history. She has never used smokeless tobacco. She reports that she does not drink alcohol and does not use drugs.  Medications:  Prior to Admission medications   Medication Sig Start Date End Date Taking? Authorizing Provider  amLODipine (NORVASC) 5 MG tablet Take by mouth.   Yes [provider]  fluticasone (FLONASE) 50 MCG/ACT nasal spray fluticasone propionate 50  mcg/actuation nasal spray,suspension   Yes [provider]  latanoprost (XALATAN) 0.005 % ophthalmic solution    Yes [provider]  levothyroxine (SYNTHROID) 75 MCG tablet levothyroxine 75 mcg tablet   Yes [provider]  pioglitazone (ACTOS) 15 MG tablet Take 15 mg by mouth daily.   Yes [provider]      Allergies  Allergen Reactions   Penicillins    Statins     ROS:  Out of a complete 14 system review of symptoms, the patient complains only of the following symptoms, and all other reviewed systems are negative.  Right arm  weakness Right foot cramps, nocturnal  Blood pressure (!) 151/67, pulse 69, height 5\' 1"  (1.549 m), weight 154 lb 6.4 oz (70 kg).  Physical Exam  General: The patient is alert and cooperative at the time of the examination.  Eyes: Pupils are equal, round, and reactive to light. Discs are flat bilaterally.  Neck: The neck is supple, no carotid bruits are noted.  Respiratory: The respiratory examination is clear.  Cardiovascular: The cardiovascular examination reveals a regular rate and rhythm, a grade I/VI systolic ejection murmur was noted in the aortic area.  Skin: Extremities are without significant edema.  Neurologic Exam  Mental status: The patient is alert and oriented x 3 at the time of the examination. The patient has apparent normal recent and remote memory, with an apparently normal attention span and concentration ability.  Cranial nerves: Facial symmetry is present. There is good sensation of the face to pinprick and soft touch bilaterally. The strength of the facial muscles and the muscles to head turning and shoulder shrug are normal bilaterally. Speech is well enunciated, no aphasia or dysarthria is noted. Extraocular movements are full. Visual fields are full. The tongue is midline, and the patient has symmetric elevation of the soft palate. No obvious hearing deficits are noted.  Motor: The motor testing reveals 5 over 5 strength of the lower extremities.  With the right upper extremity, there is diffuse weakness proximally and distally with 2-3/5 strength.  The patient does not have antigravity movement and lifting the right arm.  With the left arm, there is 4/5 strength with the left deltoid, with external rotation of the arm, and with supination.  Otherwise, strength of the left arm is normal.  Good symmetric motor tone is noted throughout.  Sensory: Sensory testing is intact to pinprick, soft touch, vibration sensation, and position sense on all 4 extremities. No  evidence of extinction is noted.  Coordination: Cerebellar testing reveals good finger-nose-finger and heel-to-shin bilaterally, but the patient cannot perform finger-nose-finger with the right arm.  Hoffmann sign on the arms is negative.  Gait and station: Gait is normal. Tandem gait is unsteady.  Romberg is negative, but is unsteady. No drift is seen.  Reflexes: Deep tendon reflexes are symmetric and normal bilaterally in the legs, reflexes in the right arm are decreased, more normal on the left. Toes are downgoing bilaterally.   Assessment/Plan:  1.  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  The etiology is not completely clear.  She has had symptoms for at least a year and a half.  There is evidence of weakness in the C5 distribution on the left arm as well.  The patient does have fasciculations in the right arm.  Although the progression is very slow for an anterior horn cell disease process, this should still  remain as a diagnostic consideration.  The patient should be followed over the next several months for any further progression of weakness in the left arm or with the legs.  I will check further blood work and a urine collection for heavy metals.  The patient will be sent for MRI of the brachial plexus.  Again, the patient will need to be followed closely over the next 6 to 12 months.  I do not believe that cervical spine surgery is likely to be beneficial in this case.  I think that it is unlikely that nerve root compression in the neck is the etiology of her current symptoms.    Marlan Palau MD 03/17/2021 10:55 AM  [facility] [address] [place], [place] [address]  Phone [phone] Fax [phone]

## 2021-03-18 ENCOUNTER — Telehealth: Payer: Self-pay | Admitting: Neurology

## 2021-03-18 NOTE — Telephone Encounter (Signed)
MR right shoulder w/wo auth: npr  Sent to GI for scheduling

## 2021-03-22 LAB — ANTI-HU, ANTI-RI, ANTI-YO IFA
Anti-Hu Ab: NEGATIVE
Anti-Ri Ab: NEGATIVE
Anti-Yo Ab: NEGATIVE

## 2021-03-22 LAB — PAN-ANCA
ANCA Proteinase 3: <3.5 U/mL (ref 0.0–3.5)
Atypical pANCA: <1:20 {titer}
C-ANCA: <1:20 {titer}
Myeloperoxidase Ab: <9 U/mL (ref 0.0–9.0)
P-ANCA: <1:20 {titer}

## 2021-03-23 ENCOUNTER — Other Ambulatory Visit: Payer: Self-pay | Admitting: Neurology

## 2021-03-23 DIAGNOSIS — G1221 Amyotrophic lateral sclerosis: Secondary | ICD-10-CM

## 2021-03-23 DIAGNOSIS — G1220 Motor neuron disease, unspecified: Secondary | ICD-10-CM

## 2021-03-23 LAB — ENA+DNA/DS+SJORGEN'S
ENA RNP Ab: 0.7 AI (ref 0.0–0.9)
ENA SM Ab Ser-aCnc: <0.2 AI (ref 0.0–0.9)
ENA SSA (RO) Ab: 5 AI — ABNORMAL HIGH (ref 0.0–0.9)
ENA SSB (LA) Ab: <0.2 AI (ref 0.0–0.9)
dsDNA Ab: 1 IU/mL (ref 0–9)

## 2021-03-23 LAB — HEAVY METALS PROFILE, URINE
Arsenic Ur: NOT DETECTED ug/L (ref 0–9)
Creatinine(Crt),U: 0.61 g/L (ref 0.30–3.00)
Lead, Rand Ur: NOT DETECTED ug/L (ref 0–49)
Mercury, Ur: NOT DETECTED ug/L (ref 0–19)

## 2021-03-23 LAB — LYME DISEASE SEROLOGY W/REFLEX: Lyme Total Antibody EIA: NEGATIVE

## 2021-03-23 LAB — INTERPRETATION

## 2021-03-23 LAB — SEDIMENTATION RATE: Sed Rate: 12 mm/hr (ref 0–40)

## 2021-03-23 LAB — HEPATITIS C ANTIBODY: Hep C Virus Ab: <0.1 s/co ratio (ref 0.0–0.9)

## 2021-03-23 LAB — ANGIOTENSIN CONVERTING ENZYME: Angio Convert Enzyme: 24 U/L (ref 14–82)

## 2021-03-23 LAB — GM1 IGG AUTOANTIBODIES: GM1 IgG Autoantibodies: <20 % (ref 0–30)

## 2021-03-23 LAB — ANA W/REFLEX: Anti Nuclear Antibody (ANA): POSITIVE — AB

## 2021-03-24 ENCOUNTER — Ambulatory Visit
Admission: RE | Admit: 2021-03-24 | Discharge: 2021-03-24 | Disposition: A | Discharge status: Home / Self Care | Payer: Medicare Other | Source: Ambulatory Visit | Attending: Neurology | Admitting: Neurology

## 2021-03-24 DIAGNOSIS — M62521 Muscle wasting and atrophy, not elsewhere classified, right upper arm: Secondary | ICD-10-CM

## 2021-03-24 IMAGING — MR MR CHEST MEDIASTINUM WO/W CM
10 series · 16 of 16 positions shown · IV contrast (multihance)
Comparison: Cervical spine MRI [DATE], right shoulder MRI
[DATE]

CLINICAL DATA: Right upper arm atrophy for 2 years

EXAM:
MRI BRACHIAL PLEXUS WITHOUT AND WITH CONTRAST
TECHNIQUE: Multiplanar, multiecho pulse sequences of the neck and surrounding
structures were obtained without and with intravenous contrast. The
field of view was focused on the rightbrachial plexus from the
neural foramina to the axilla.
CONTRAST:  14mL MULTIHANCE GADOBENATE DIMEGLUMINE 529 MG/ML IV SOLN

[Series 2: T1 · axial · 3.0mm · 1.33mm/px · 1 of 40 slices shown (1 of 3)]
[im 1/40]
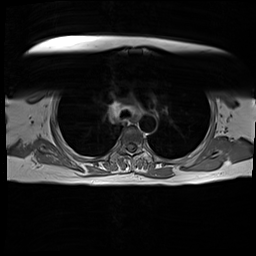

[Series 3: T2 fat-sat · axial · 3.0mm · 1.33mm/px · z∈[-87,+62]mm · 2 of 40 slices shown]
[im 1/40]
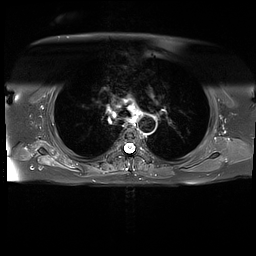
[im 40/40]
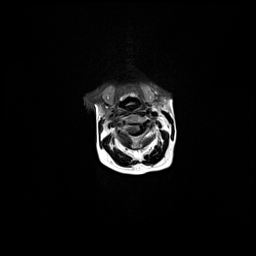

[Series 4: T1 · coronal · 3.0mm · 0.75mm/px · 1 of 24 slices shown (2 of 3)]
[im 1/24]
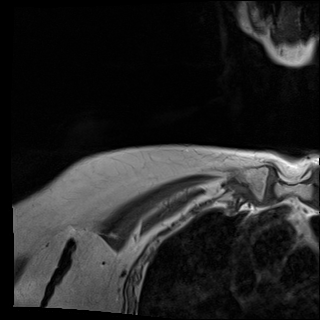

[Series 5: STIR · coronal · 3.0mm · 0.94mm/px · 1 of 24 slices shown]
[im 1/24]
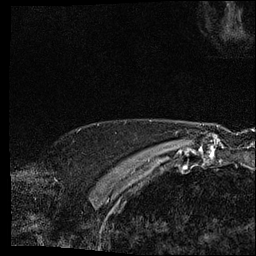

[Series 7: T2 · sagittal · 3.0mm · 0.94mm/px · 2 of 41 slices shown]
[im 1/41]
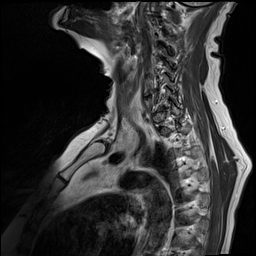
[im 41/41]
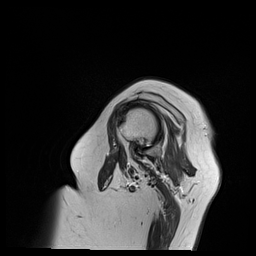

[Series 8: T1 · sagittal · 3.0mm · 0.94mm/px · 2 of 41 slices shown (3 of 3)]
[im 1/41]
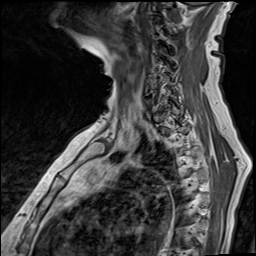
[im 41/41]
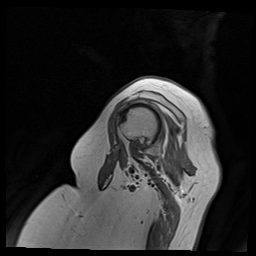

[Series 9: T1 fat-sat · axial · non-contrast · 3.0mm · 1.33mm/px · z∈[-87,+62]mm · 2 of 40 slices shown]
[im 1/40]
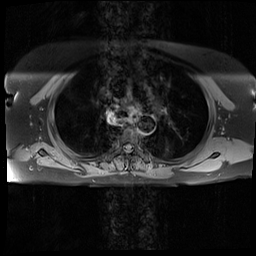
[im 40/40]
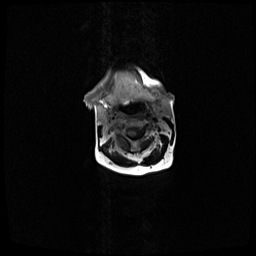

[Series 10: T1 fat-sat post-contrast · axial · 3.0mm · 1.33mm/px · z∈[-87,+62]mm · 2 of 40 slices shown (1 of 3)]
[im 1/40]
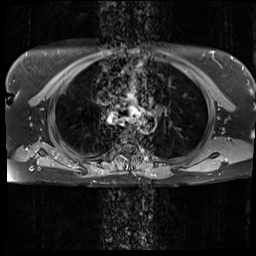
[im 40/40]
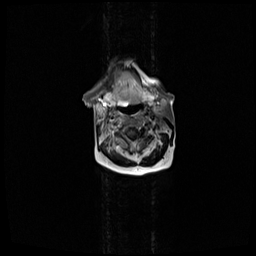

[Series 11: T1 fat-sat post-contrast · sagittal · 3.0mm · 0.94mm/px · 2 of 41 slices shown (2 of 3)]
[im 1/41]
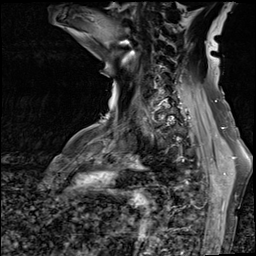
[im 41/41]
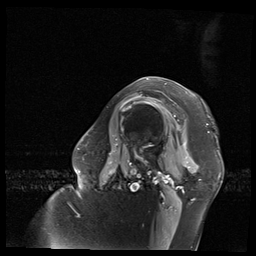

[Series 12: T1 fat-sat post-contrast · coronal · 3.0mm · 0.75mm/px · 1 of 24 slices shown (3 of 3)]
[im 1/24]
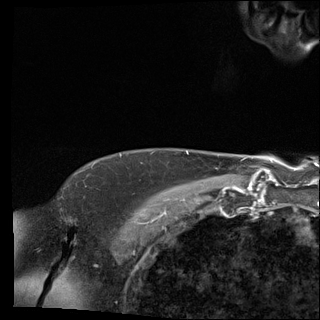

[16 of 16 positions shown; findings below may reference images not displayed]

FINDINGS: Technical note: Despite efforts by the technologist and patient,
motion artifact is present on today's exam and could not be
eliminated. This reduces exam sensitivity and specificity.

Spinal cord

Normal signal and morphology of the cervical cord.

Brachial plexus:

Scalene triangle, costoclavicular space, and pectoralis minor space
are within normal limits. No evidence of fibrous band or mass
lesion.

Roots: Mildly increased T2 signal within the proximal C5 through C8
nerve roots (series 5, image 16). Findings are similar in appearance
to the contralateral side on limited view.

Trunks: Unremarkable.

Divisions: Unremarkable.

Cords: Unremarkable.

Distal brachial plexus/branches: Unremarkable.

Muscles and tendons

Musculature of the shoulder girdle and visualized chest wall appear
normal without edema, atrophy, or fatty infiltration to suggest
denervation changes. Rotator cuff tendons appear grossly intact, not
well evaluated at the edge of the field of view.

Bones

No cervical rib. The right first rib is normal in appearance.
Multilevel degenerative disc disease of the cervical spine, without
evidence to suggest significant interval progression from the
previous cervical spine MRI of [DATE]. Visualized marrow
structures are within normal limits. No fracture or marrow replacing
lesion.

Joints

Moderate arthropathy of the right glenohumeral and acromioclavicular
joints. No joint effusion.

Other findings

None.
IMPRESSION: 1. Mildly increased T2 signal within the proximal right C5 through
C8 nerve roots, likely related to underlying foraminal stenosis
within the cervical spine. Findings are similar in appearance to the
contralateral side on limited view. Otherwise unremarkable MRI of
the right brachial plexus.
2. Multilevel cervical spondylosis without evidence to suggest
significant interval progression from the previous cervical spine
MRI [DATE]. Please refer to cervical spine MRI report for level
by level detail.
3. Moderate arthropathy of the right glenohumeral and
acromioclavicular joints.

## 2021-03-24 MED ORDER — GADOBENATE DIMEGLUMINE 529 MG/ML IV SOLN
14.0000 mL | Freq: Once | INTRAVENOUS | Status: AC | PRN
  Administered 2021-03-24: 14 mL via INTRAVENOUS

## 2021-03-28 ENCOUNTER — Telehealth: Payer: Self-pay | Admitting: *Deleted

## 2021-03-28 NOTE — Telephone Encounter (Signed)
Received notes from [facility].

## 2021-03-28 NOTE — Telephone Encounter (Signed)
I called [facility] to initiate this referral to a neuromuscular specialist.  They asked that our records to be faxed to [phone].

## 2021-03-29 NOTE — Telephone Encounter (Signed)
Working with Stanton Kidney to figure out what else we need to send for the referral.

## 2021-03-29 NOTE — Telephone Encounter (Signed)
I faxed notes from [facility] the emg the imaging report to [phone].

## 2021-03-29 NOTE — Telephone Encounter (Signed)
Baxter Hire spoke with [facility] yesterday about this referral. It is in the works. Debra faxed the notes from [facility] this morning. Stanton Kidney is still waiting on the MRI CD's but will mail them when she gets them.

## 2021-03-29 NOTE — Telephone Encounter (Signed)
[facility], Rosey Bath, called me and informed me they have an opening tomorrow and will reach out to the patient to offer this to her.

## 2021-03-31 ENCOUNTER — Telehealth: Payer: Self-pay | Admitting: *Deleted

## 2021-03-31 NOTE — Telephone Encounter (Signed)
Pt mr brain, mrc spine ,mr chest  c-spine mri fed ex to [facility] [address] [place] [id number]

## 2021-04-02 ENCOUNTER — Telehealth: Payer: Self-pay | Admitting: Neurology

## 2021-04-02 NOTE — Telephone Encounter (Signed)
MRI of the brachial plexus does not show any infiltrative abnormalities or evidence of hypertrophy of the nerve fibers suggesting demyelinating features.  Interestingly, the appearance of the brachial plexus is similar on both sides with mildly increased T2 signal.  We will need to follow the patient for any spread of weakness involving the left arm or the lower extremities that was suggest an anterior horn cell disease process.    MRI brachial plexus 03/24/21:  IMPRESSION: 1. Mildly increased T2 signal within the proximal right C5 through C8 nerve roots, likely related to underlying foraminal stenosis within the cervical spine. Findings are similar in appearance to the contralateral side on limited view. Otherwise unremarkable MRI of the right brachial plexus. 2. Multilevel cervical spondylosis without evidence to suggest significant interval progression from the previous cervical spine MRI 03/14/2021. Please refer to cervical spine MRI report for level by level detail. 3. 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

## 2021-04-11 ENCOUNTER — Emergency Department (HOSPITAL_COMMUNITY): Payer: Medicare Other

## 2021-04-11 ENCOUNTER — Inpatient Hospital Stay (HOSPITAL_COMMUNITY)
Admission: EM | Admit: 2021-04-11 | Discharge: 2021-04-16 | DRG: 084 | Disposition: A | Discharge status: Home Health | Payer: Medicare Other | Attending: Internal Medicine | Admitting: Internal Medicine

## 2021-04-11 ENCOUNTER — Encounter (HOSPITAL_COMMUNITY): Payer: Self-pay | Admitting: Emergency Medicine

## 2021-04-11 ENCOUNTER — Other Ambulatory Visit: Payer: Self-pay

## 2021-04-11 DIAGNOSIS — D649 Anemia, unspecified: Secondary | ICD-10-CM

## 2021-04-11 DIAGNOSIS — S02119A Unspecified fracture of occiput, initial encounter for closed fracture: Secondary | ICD-10-CM | POA: Diagnosis present

## 2021-04-11 DIAGNOSIS — Z800 Family history of malignant neoplasm of digestive organs: Secondary | ICD-10-CM

## 2021-04-11 DIAGNOSIS — Z87891 Personal history of nicotine dependence: Secondary | ICD-10-CM

## 2021-04-11 DIAGNOSIS — I609 Nontraumatic subarachnoid hemorrhage, unspecified: Secondary | ICD-10-CM

## 2021-04-11 DIAGNOSIS — I619 Nontraumatic intracerebral hemorrhage, unspecified: Secondary | ICD-10-CM

## 2021-04-11 DIAGNOSIS — Z20822 Contact with and (suspected) exposure to covid-19: Secondary | ICD-10-CM | POA: Diagnosis present

## 2021-04-11 DIAGNOSIS — H9190 Unspecified hearing loss, unspecified ear: Secondary | ICD-10-CM | POA: Diagnosis present

## 2021-04-11 DIAGNOSIS — W19XXXA Unspecified fall, initial encounter: Secondary | ICD-10-CM | POA: Diagnosis present

## 2021-04-11 DIAGNOSIS — R402362 Coma scale, best motor response, obeys commands, at arrival to emergency department: Secondary | ICD-10-CM | POA: Diagnosis present

## 2021-04-11 DIAGNOSIS — Z90710 Acquired absence of both cervix and uterus: Secondary | ICD-10-CM

## 2021-04-11 DIAGNOSIS — K5900 Constipation, unspecified: Secondary | ICD-10-CM | POA: Diagnosis not present

## 2021-04-11 DIAGNOSIS — S066X9A Traumatic subarachnoid hemorrhage with loss of consciousness of unspecified duration, initial encounter: Secondary | ICD-10-CM | POA: Diagnosis present

## 2021-04-11 DIAGNOSIS — S069X9A Unspecified intracranial injury with loss of consciousness of unspecified duration, initial encounter: Secondary | ICD-10-CM

## 2021-04-11 DIAGNOSIS — E039 Hypothyroidism, unspecified: Secondary | ICD-10-CM

## 2021-04-11 DIAGNOSIS — E1140 Type 2 diabetes mellitus with diabetic neuropathy, unspecified: Secondary | ICD-10-CM | POA: Diagnosis present

## 2021-04-11 DIAGNOSIS — Z79899 Other long term (current) drug therapy: Secondary | ICD-10-CM

## 2021-04-11 DIAGNOSIS — R402142 Coma scale, eyes open, spontaneous, at arrival to emergency department: Secondary | ICD-10-CM | POA: Diagnosis present

## 2021-04-11 DIAGNOSIS — S065X9A Traumatic subdural hemorrhage with loss of consciousness of unspecified duration, initial encounter: Secondary | ICD-10-CM | POA: Diagnosis not present

## 2021-04-11 DIAGNOSIS — I10 Essential (primary) hypertension: Secondary | ICD-10-CM | POA: Diagnosis present

## 2021-04-11 DIAGNOSIS — R402252 Coma scale, best verbal response, oriented, at arrival to emergency department: Secondary | ICD-10-CM | POA: Diagnosis present

## 2021-04-11 DIAGNOSIS — S065XAA Traumatic subdural hemorrhage with loss of consciousness status unknown, initial encounter: Secondary | ICD-10-CM | POA: Diagnosis present

## 2021-04-11 DIAGNOSIS — Z833 Family history of diabetes mellitus: Secondary | ICD-10-CM

## 2021-04-11 LAB — CBC WITH DIFFERENTIAL/PLATELET
Abs Immature Granulocytes: 0.05 10*3/uL (ref 0.00–0.07)
Basophils Absolute: 0 10*3/uL (ref 0.0–0.1)
Basophils Relative: 0 %
Eosinophils Absolute: 0 10*3/uL (ref 0.0–0.5)
Eosinophils Relative: 0 %
HCT: 36.8 % (ref 36.0–46.0)
Hemoglobin: 11.9 g/dL — ABNORMAL LOW (ref 12.0–15.0)
Immature Granulocytes: 1 %
Lymphocytes Relative: 10 %
Lymphs Abs: 0.8 10*3/uL (ref 0.7–4.0)
MCH: 30.1 pg (ref 26.0–34.0)
MCHC: 32.3 g/dL (ref 30.0–36.0)
MCV: 93.2 fL (ref 80.0–100.0)
Monocytes Absolute: 0.5 10*3/uL (ref 0.1–1.0)
Monocytes Relative: 6 %
Neutro Abs: 7.2 10*3/uL (ref 1.7–7.7)
Neutrophils Relative %: 83 %
Platelets: 160 10*3/uL (ref 150–400)
RBC: 3.95 MIL/uL (ref 3.87–5.11)
RDW: 13.3 % (ref 11.5–15.5)
WBC: 8.6 10*3/uL (ref 4.0–10.5)
nRBC: 0 % (ref 0.0–0.2)

## 2021-04-11 LAB — AMMONIA: Ammonia: 14 umol/L (ref 9–35)

## 2021-04-11 LAB — COMPREHENSIVE METABOLIC PANEL
ALT: 22 U/L (ref 0–44)
AST: 23 U/L (ref 15–41)
Albumin: 3.4 g/dL — ABNORMAL LOW (ref 3.5–5.0)
Alkaline Phosphatase: 59 U/L (ref 38–126)
Anion gap: 9 (ref 5–15)
BUN: 15 mg/dL (ref 8–23)
CO2: 25 mmol/L (ref 22–32)
Calcium: 9 mg/dL (ref 8.9–10.3)
Chloride: 102 mmol/L (ref 98–111)
Creatinine, Ser: 0.68 mg/dL (ref 0.44–1.00)
GFR, Estimated: >60 mL/min (ref >60)
Glucose, Bld: 177 mg/dL — ABNORMAL HIGH (ref 70–99)
Potassium: 4 mmol/L (ref 3.5–5.1)
Sodium: 136 mmol/L (ref 135–145)
Total Bilirubin: 0.4 mg/dL (ref 0.3–1.2)
Total Protein: 7.1 g/dL (ref 6.5–8.1)

## 2021-04-11 LAB — TROPONIN I (HIGH SENSITIVITY): Troponin I (High Sensitivity): 11 ng/L (ref <18)

## 2021-04-11 IMAGING — DX DG CHEST 1V PORT
1 series · 1 of 1 positions shown · non-contrast
Comparison: None.

CLINICAL DATA: Recent syncopal episode

EXAM:
PORTABLE CHEST 1 VIEW

[chest]
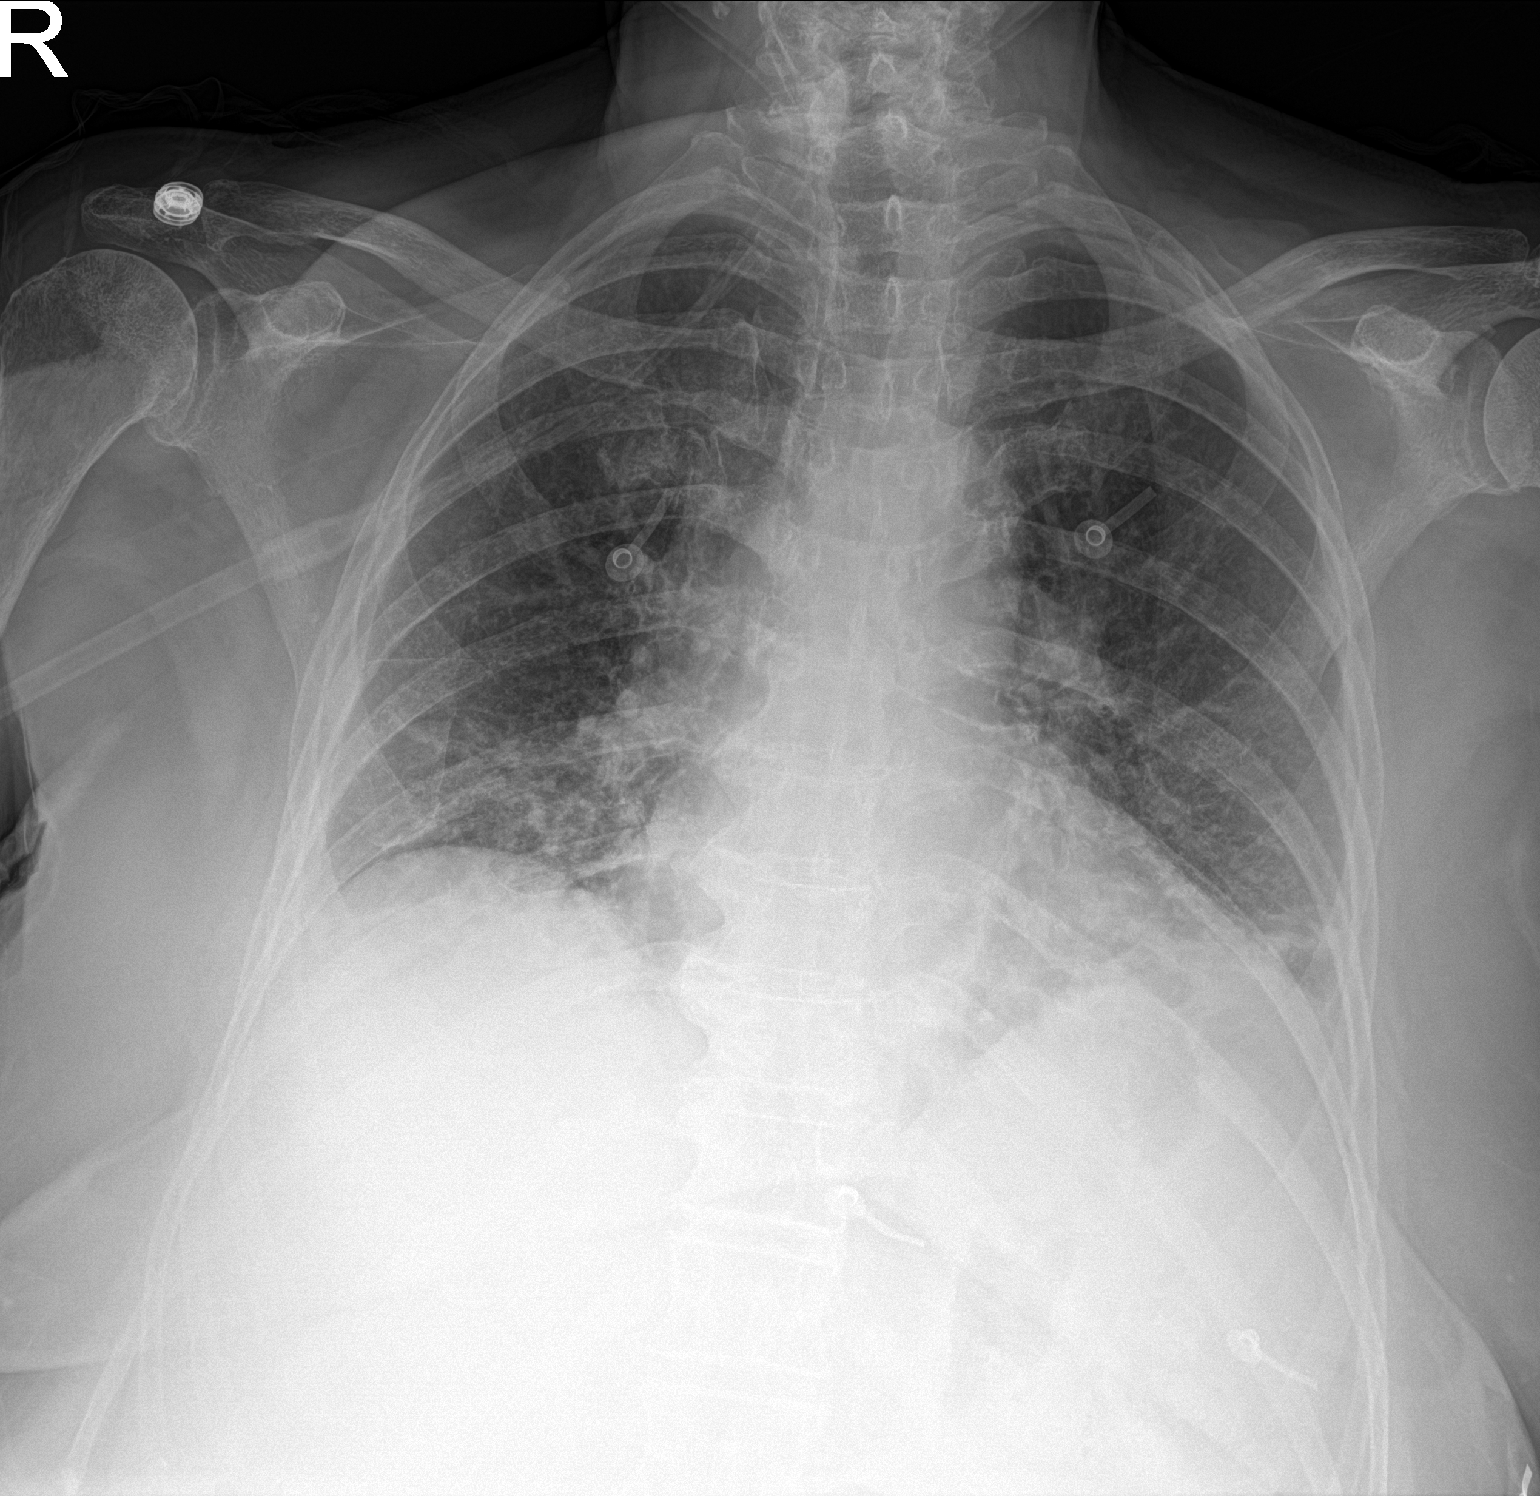

[1 of 1 positions shown; findings below may reference images not displayed]

FINDINGS: Cardiac shadow is within normal limits. The lungs are well aerated
bilaterally. Bibasilar atelectatic changes are seen. No acute bony
abnormality is noted.
IMPRESSION: Mild bibasilar atelectasis.

## 2021-04-11 IMAGING — CT CT HEAD W/O CM
3 series · 15 of 47 positions shown, 18 images · non-contrast
Comparison: MRI head [DATE]

CLINICAL DATA: Status post fall

EXAM:
CT HEAD WITHOUT CONTRAST
CT CERVICAL SPINE WITHOUT CONTRAST
TECHNIQUE: Multidetector CT imaging of the head and cervical spine was
performed following the standard protocol without intravenous
contrast. Multiplanar CT image reconstructions of the cervical spine
were also generated.

[Series 1: head 5.0 h30s · axial · 0.46mm/px · z∈[-228,-93]mm · 9 of 33 slices shown, 12 images]
[im 3/33  brain]
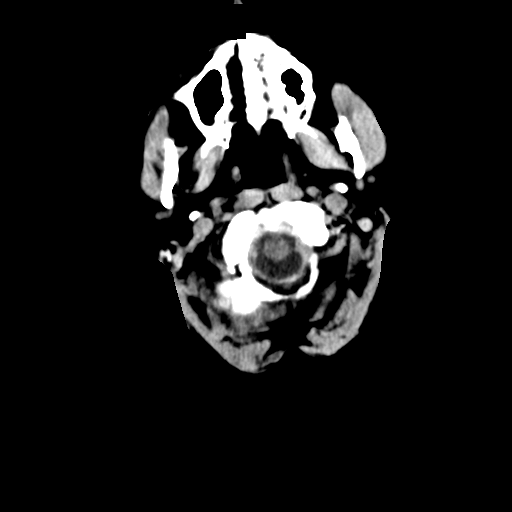
[im 3/33  bone]
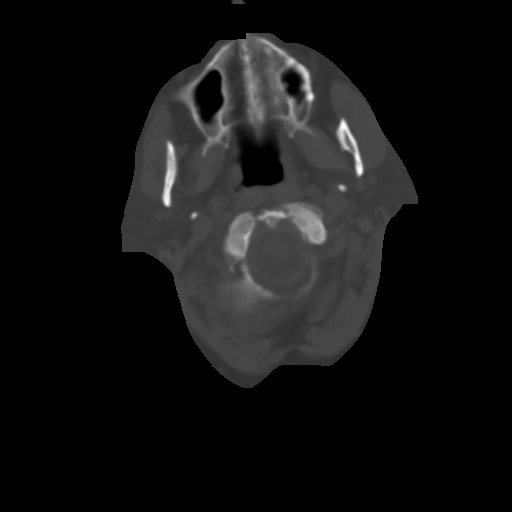
[im 6/33  brain]
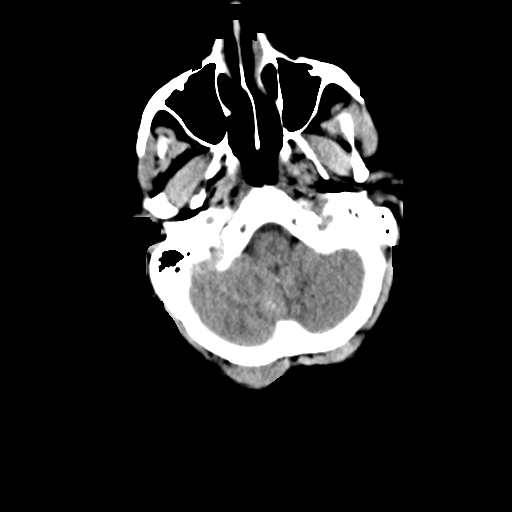
[im 9/33  brain]
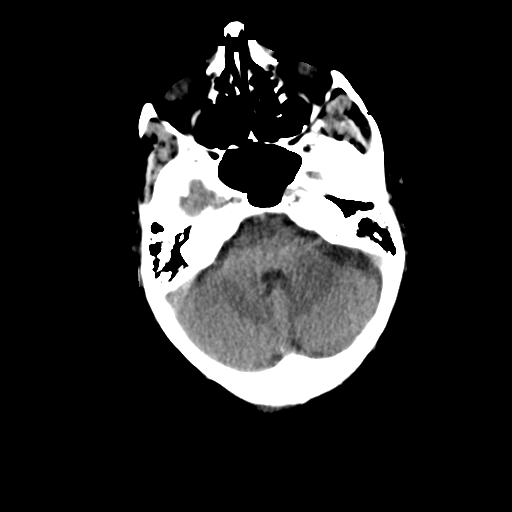
[im 13/33  brain]
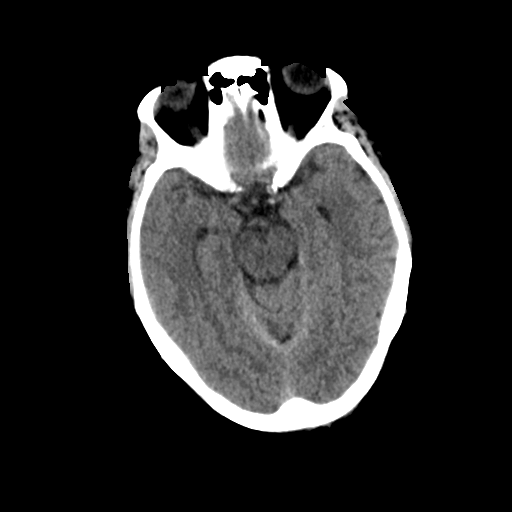
[im 17/33  brain]
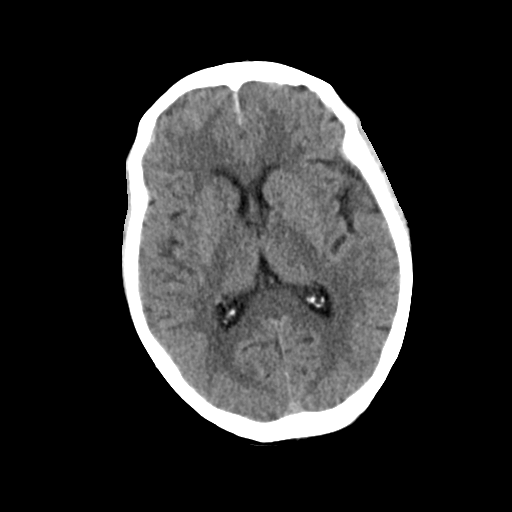
[im 17/33  bone]
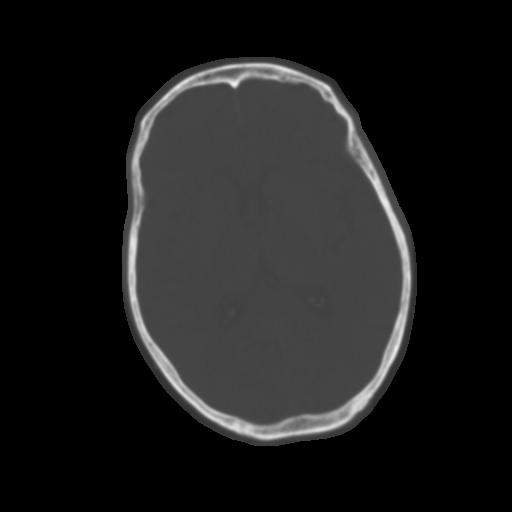
[im 20/33  brain]
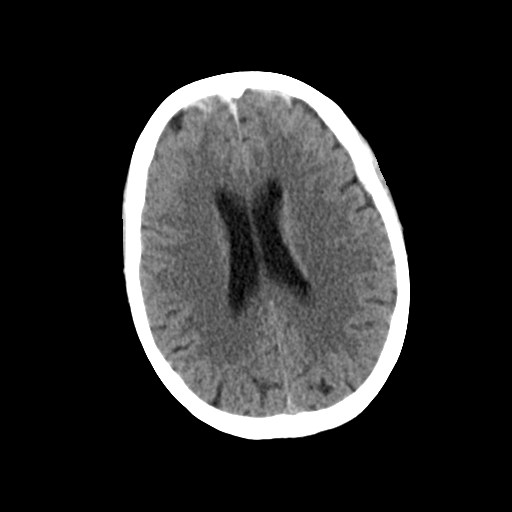
[im 24/33  brain]
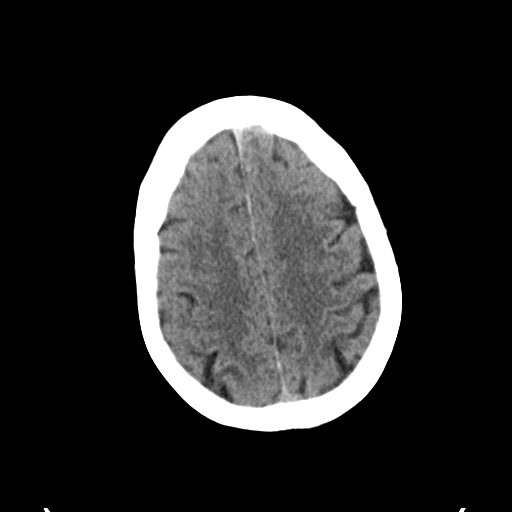
[im 27/33  brain]
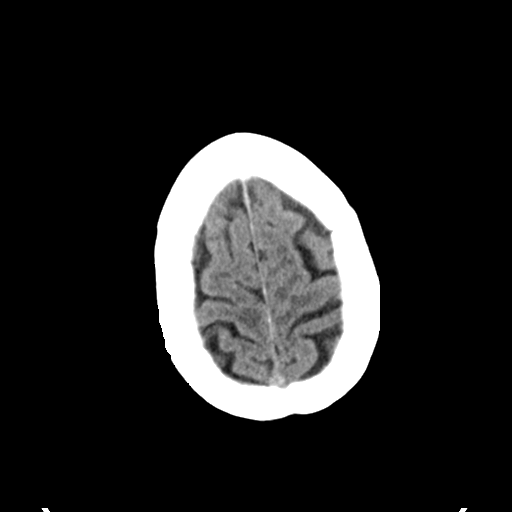
[im 30/33  brain]
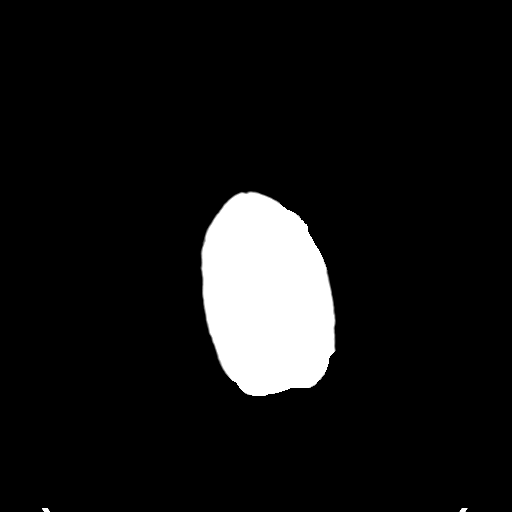
[im 30/33  bone]
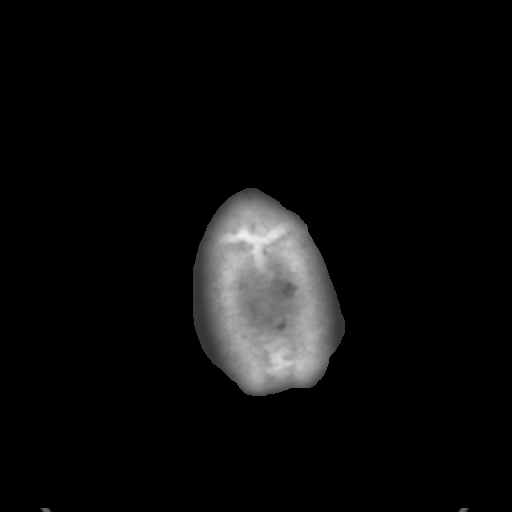

[Series 5: head 3.0 mpr cor · coronal · 0.32mm/px · 3 of 66 slices shown]
[im 22/66  brain]
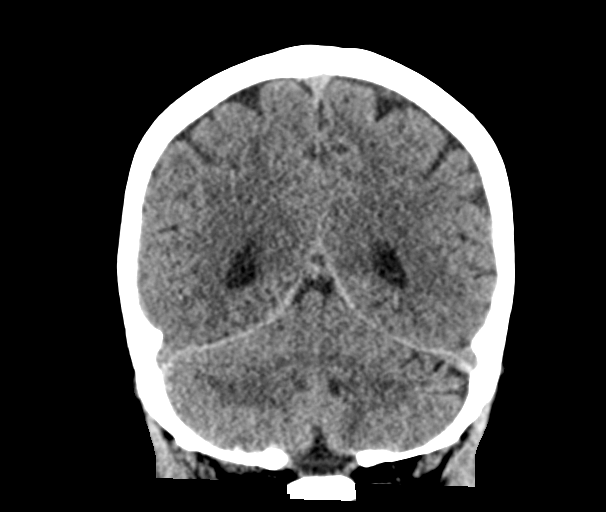
[im 29/66  brain]
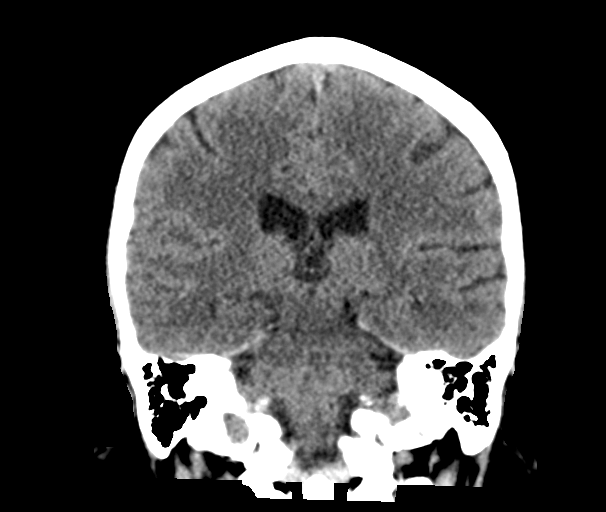
[im 37/66  brain]
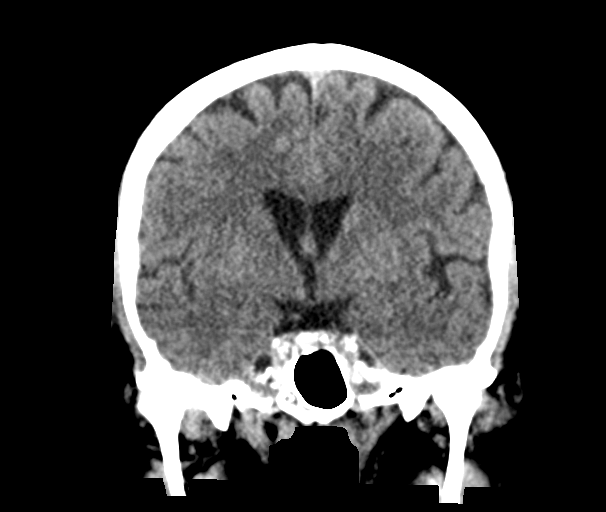

[Series 6: head 3.0 mpr sag · sagittal · 0.32mm/px · 3 of 65 slices shown]
[im 22/65  brain]
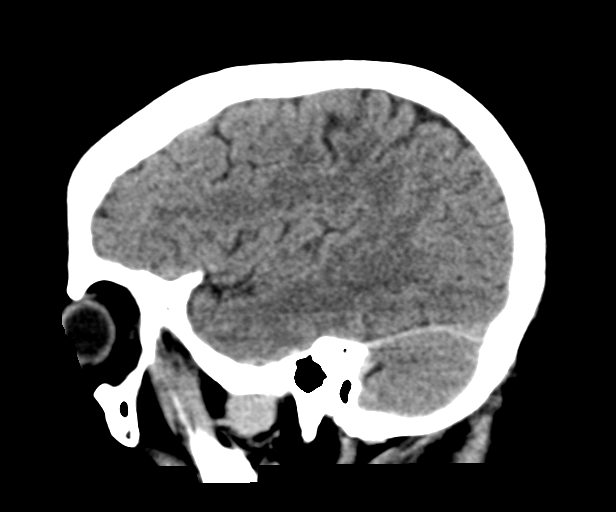
[im 33/65  brain]
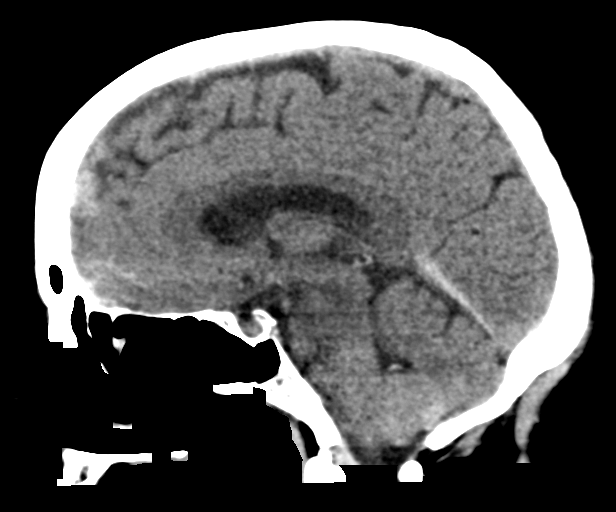
[im 43/65  brain]
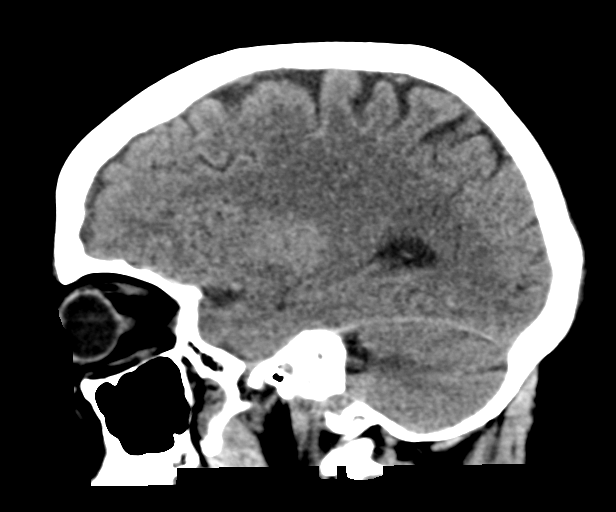

[15 of 47 positions shown; findings below may reference images not displayed]

FINDINGS: CT HEAD FINDINGS

Brain:

No evidence of large-territorial acute infarction. Right frontal
cortex 8 mm intraparenchymal hemorrhage. Bilateral frontal small
volume subarachnoid hemorrhage. Right cerebellar subarachnoid
hemorrhage. Trace anterior thickening/hyperdensity of the falx
cerebral on the right consistent with a parafalcine subdural
hematoma. No mass lesion. No mass effect or midline shift. No
hydrocephalus. Basilar cisterns are patent.

Vascular: No hyperdense vessel. Atherosclerotic calcifications are
present within the cavernous internal carotid arteries.

Skull: Nondisplaced right occipital skull fracture with extension to
the jugular foramen. Focal lesion.

Sinuses/Orbits: Paranasal sinuses and mastoid air cells are clear.
Bilateral lens replacement. Otherwise the orbits are unremarkable.

Other: None.

CT CERVICAL SPINE FINDINGS

Alignment: Normal.

Skull base and vertebrae: Multilevel degenerative changes of the
spine. No acute fracture. No aggressive appearing focal osseous
lesion or focal pathologic process.

Soft tissues and spinal canal: No prevertebral fluid or swelling. No
visible canal hematoma.

Upper chest: Unremarkable.

Other: None.
IMPRESSION: 1. Right frontal cortex 8 mm intraparenchymal hemorrhage.
2. Bilateral frontal small volume subarachnoid hemorrhage.
3. Right cerebellar subarachnoid hemorrhage.
4. Trace right parafalcine subdural hematoma anteriorly.
5. Nondisplaced right occipital skull fracture that extends to the
jugular foramen.
6. No acute displaced fracture or traumatic listhesis of the
cervical spine.

These results were called by telephone at the time of interpretation
on [DATE] at [DATE] to provider Dr. NORALIEN, who verbally
acknowledged these results.

## 2021-04-11 IMAGING — CT CT CERVICAL SPINE W/O CM
3 of 4 series · 12 of 33 positions shown, 14 images · non-contrast
Comparison: MRI head [DATE]

CLINICAL DATA: Status post fall

EXAM:
CT HEAD WITHOUT CONTRAST
CT CERVICAL SPINE WITHOUT CONTRAST
TECHNIQUE: Multidetector CT imaging of the head and cervical spine was
performed following the standard protocol without intravenous
contrast. Multiplanar CT image reconstructions of the cervical spine
were also generated.

[Series 5: c_spine 2.0 st · axial · 0.35mm/px · z∈[-368,-242]mm · 4 of 95 slices shown, 5 images]
[im 16/95  soft-tissue]
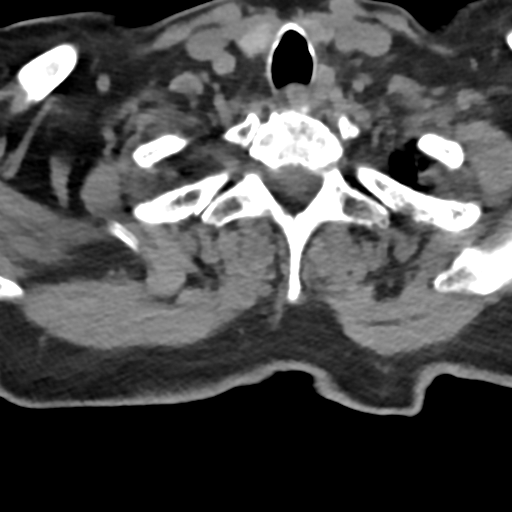
[im 16/95  bone]
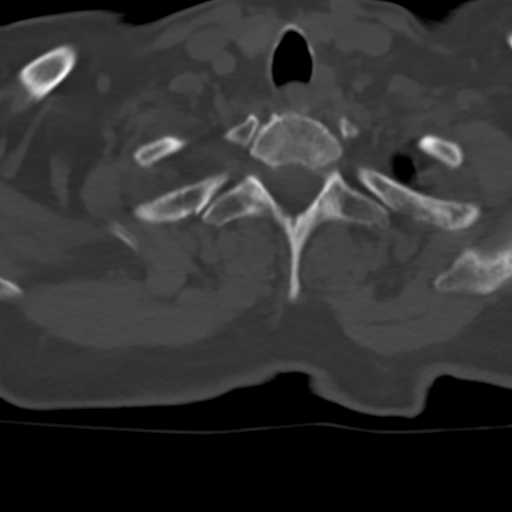
[im 32/95  bone]
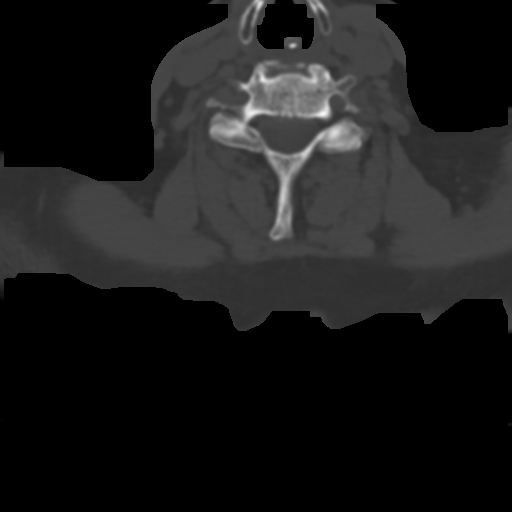
[im 63/95  bone]
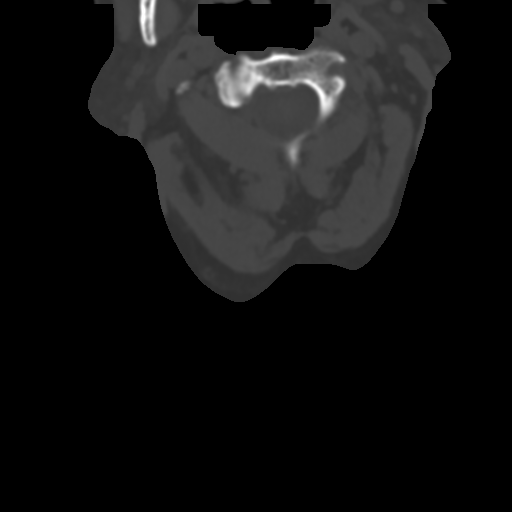
[im 79/95  bone]
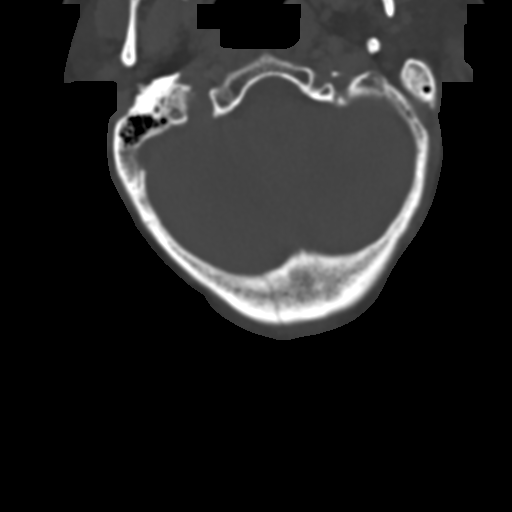

[Series 8: coronal bone · coronal · 0.25mm/px · 3 of 61 slices shown]
[im 13/61  bone]
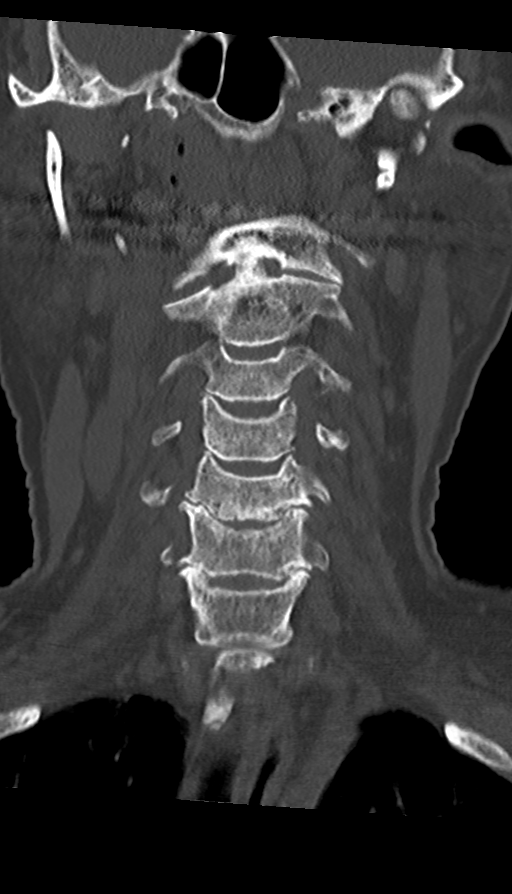
[im 25/61  bone]
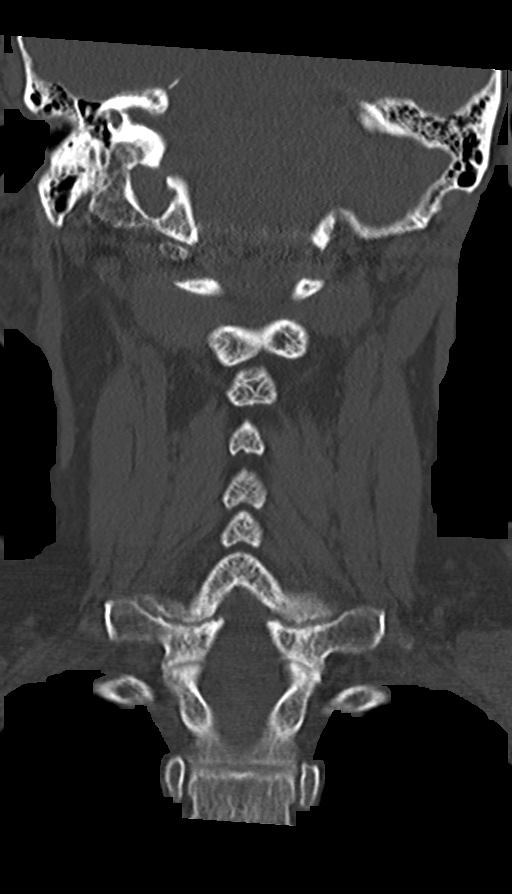
[im 37/61  bone]
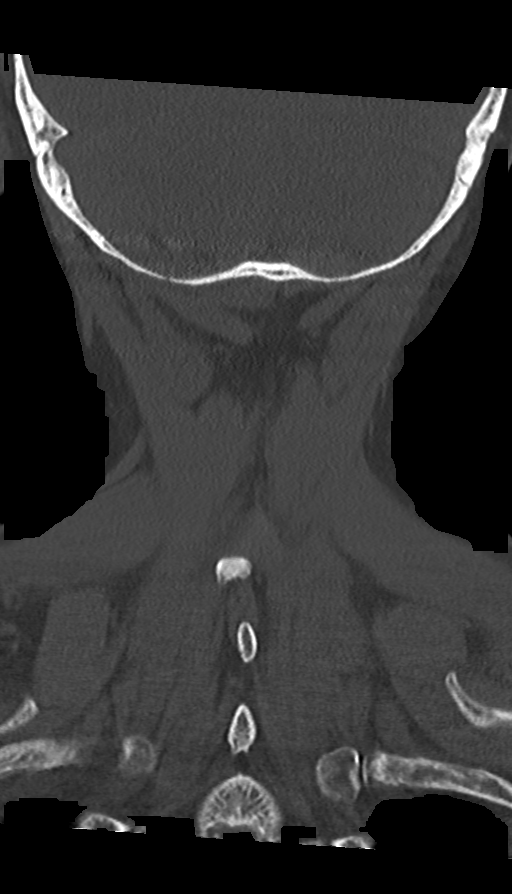

[Series 9: sagittal bone · sagittal · 0.29mm/px · 5 of 61 slices shown, 6 images]
[im 21/61  bone]
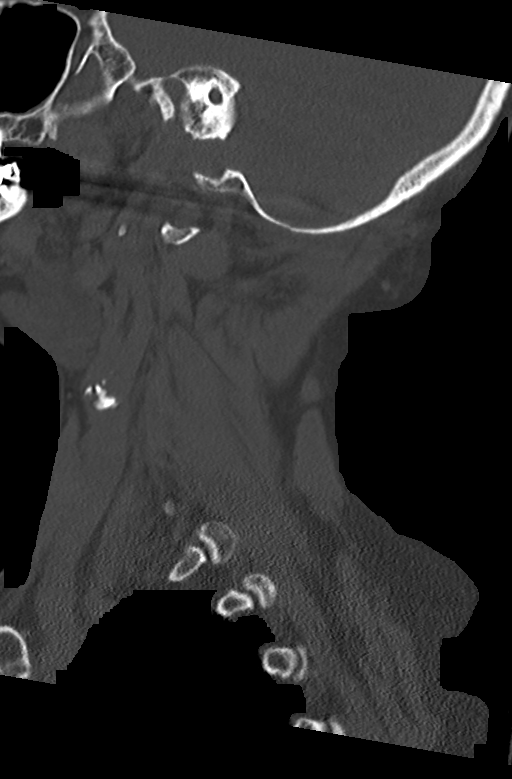
[im 26/61  bone]
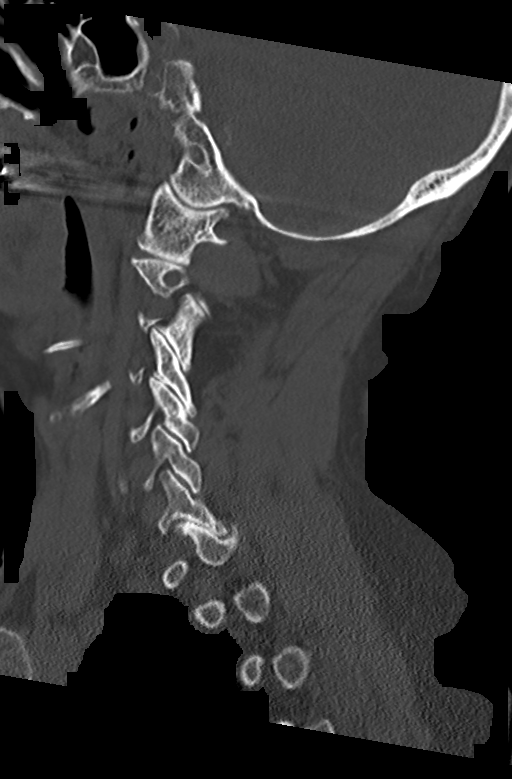
[im 31/61  soft-tissue]
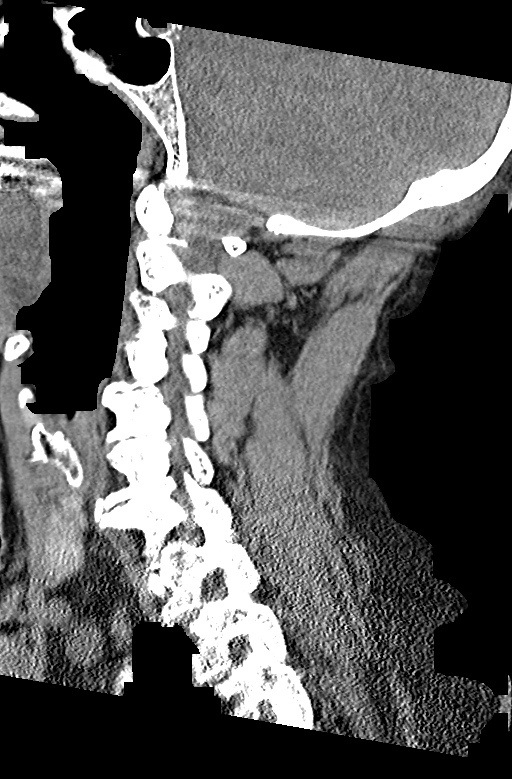
[im 31/61  bone]
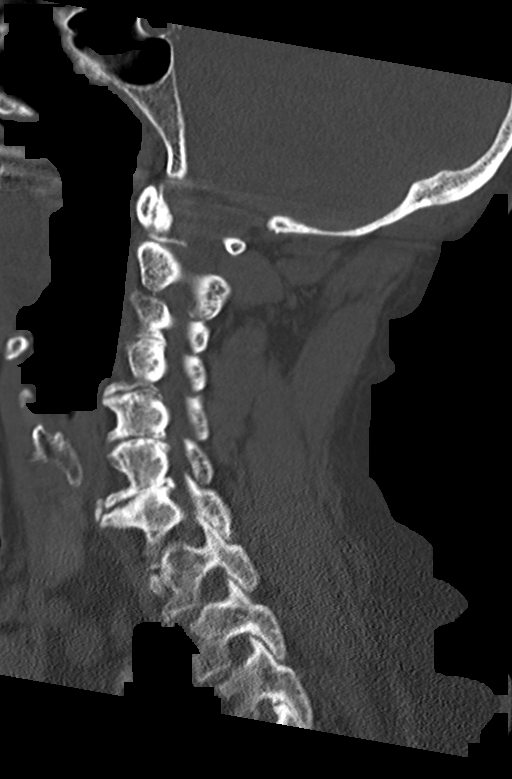
[im 36/61  bone]
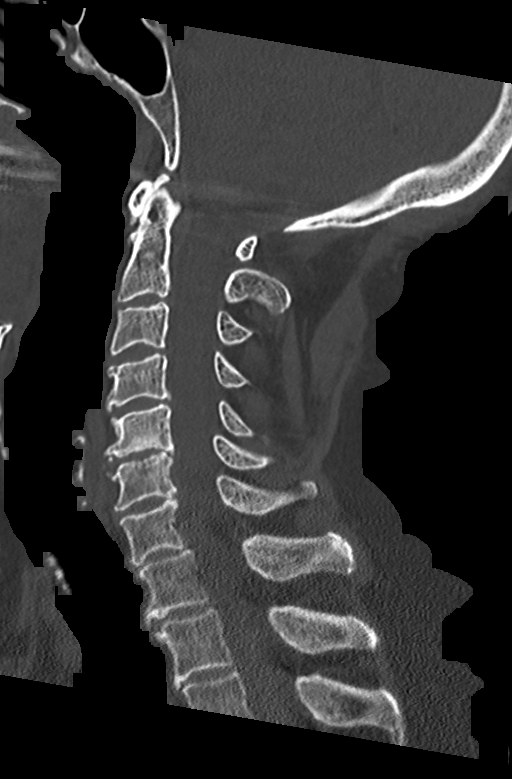
[im 41/61  bone]
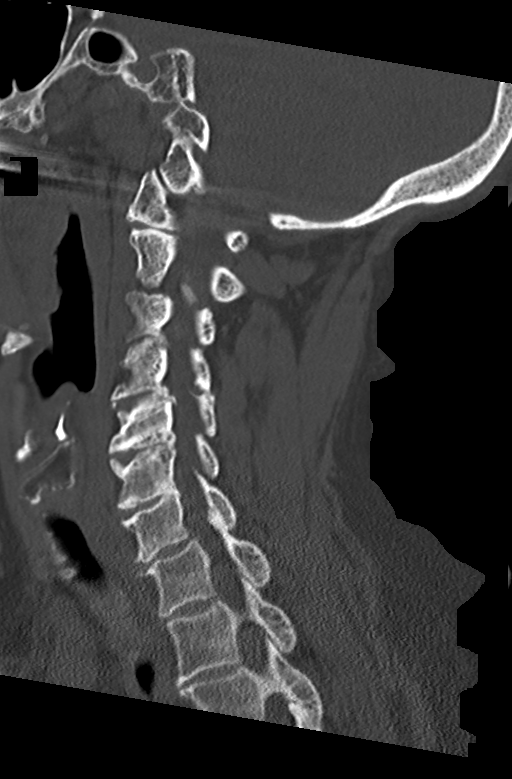

[12 of 33 positions shown; findings below may reference images not displayed]

FINDINGS: CT HEAD FINDINGS

Brain:

No evidence of large-territorial acute infarction. Right frontal
cortex 8 mm intraparenchymal hemorrhage. Bilateral frontal small
volume subarachnoid hemorrhage. Right cerebellar subarachnoid
hemorrhage. Trace anterior thickening/hyperdensity of the falx
cerebral on the right consistent with a parafalcine subdural
hematoma. No mass lesion. No mass effect or midline shift. No
hydrocephalus. Basilar cisterns are patent.

Vascular: No hyperdense vessel. Atherosclerotic calcifications are
present within the cavernous internal carotid arteries.

Skull: Nondisplaced right occipital skull fracture with extension to
the jugular foramen. Focal lesion.

Sinuses/Orbits: Paranasal sinuses and mastoid air cells are clear.
Bilateral lens replacement. Otherwise the orbits are unremarkable.

Other: None.

CT CERVICAL SPINE FINDINGS

Alignment: Normal.

Skull base and vertebrae: Multilevel degenerative changes of the
spine. No acute fracture. No aggressive appearing focal osseous
lesion or focal pathologic process.

Soft tissues and spinal canal: No prevertebral fluid or swelling. No
visible canal hematoma.

Upper chest: Unremarkable.

Other: None.
IMPRESSION: 1. Right frontal cortex 8 mm intraparenchymal hemorrhage.
2. Bilateral frontal small volume subarachnoid hemorrhage.
3. Right cerebellar subarachnoid hemorrhage.
4. Trace right parafalcine subdural hematoma anteriorly.
5. Nondisplaced right occipital skull fracture that extends to the
jugular foramen.
6. No acute displaced fracture or traumatic listhesis of the
cervical spine.

These results were called by telephone at the time of interpretation
on [DATE] at [DATE] to provider Dr. NORALIEN, who verbally
acknowledged these results.

## 2021-04-11 MED ORDER — SODIUM CHLORIDE 0.9 % IV SOLN: 2000.0000 mg | Freq: Once | INTRAVENOUS | Status: DC

## 2021-04-11 MED ORDER — CLEVIDIPINE BUTYRATE 0.5 MG/ML IV EMUL
0.0000 mg/h | INTRAVENOUS | Status: DC
  Administered 2021-04-11: 1 mg/h via INTRAVENOUS
  Filled 2021-04-11: qty 50

## 2021-04-11 MED ORDER — ONDANSETRON HCL 4 MG/2ML IJ SOLN
4.0000 mg | Freq: Once | INTRAMUSCULAR | Status: AC
  Administered 2021-04-11: 4 mg via INTRAVENOUS
  Filled 2021-04-11: qty 2

## 2021-04-11 MED ORDER — LEVETIRACETAM IN NACL 1000 MG/100ML IV SOLN
1000.0000 mg | INTRAVENOUS | Status: AC
  Administered 2021-04-12: 1000 mg via INTRAVENOUS
  Filled 2021-04-11: qty 100

## 2021-04-11 NOTE — ED Provider Notes (Signed)
[facility] EMERGENCY DEPARTMENT Provider Note   CSN: [id number] Arrival date & time: 04/11/21  1939     History Chief Complaint  Patient presents with   Loss of Consciousness    Alexandria Potts is a 80 y.o. female.  Patient with baseline history of right upper extremity weakness, presents ER chief complaint of fall, loss of consciousness.  Family states that she had fallen 3 days ago in the bathroom but was able to get up and appeared well.  Today they were headed out when she was stepping out of her home she fell down and was unresponsive on the ground.  Family states she was unresponsive for about 1 or 2 minutes before waking up.  She had nausea and vomiting complaining of headache and of difficult time walking, brought to the ER for further evaluation.  Patient herself denies any chest pain.  Complaining of headache.  No abdominal pain or back pain.      Past Medical History:  Diagnosis Date   Carpal tunnel syndrome    right   Hypothyroidism     Patient Active Problem List   Diagnosis Date Noted   Right arm weakness 03/08/2021    Past Surgical History:  Procedure Laterality Date   APPENDECTOMY  1995   CARPAL TUNNEL RELEASE Right 10/2019   ELBOW SURGERY Right 10/2019   HYSTERECTOMY     TONSILLECTOMY     as a child     OB History   No obstetric history on file.     Family History  Problem Relation Age of Onset   Heart Problems Mother    Other Mother        "lung problems"   Heart Problems Father    Other Father        "lung problems"   Diabetes Other        both sides     Social History   Tobacco Use   Smoking status: Former    Packs/day: 0.50    Years: 3.00    Pack years: 1.50    Types: Cigarettes   Smokeless tobacco: Never   Tobacco comments:    in her early 48s  Vaping Use   Vaping Use: Never used  Substance Use Topics   Alcohol use: Never   Drug use: Never    Home Medications Prior to Admission medications    Medication Sig Start Date End Date Taking? Authorizing Provider  amLODipine (NORVASC) 5 MG tablet Take by mouth.    [provider]  fluticasone (FLONASE) 50 MCG/ACT nasal spray fluticasone propionate 50 mcg/actuation nasal spray,suspension    [provider]  latanoprost (XALATAN) 0.005 % ophthalmic solution     [provider]  levothyroxine (SYNTHROID) 75 MCG tablet levothyroxine 75 mcg tablet    [provider]  pioglitazone (ACTOS) 15 MG tablet Take 15 mg by mouth daily.    [provider]    Allergies    Penicillins and Statins  Review of Systems   Review of Systems  Constitutional:  Negative for fever.  HENT:  Negative for ear pain.   Eyes:  Negative for pain.  Respiratory:  Negative for cough.   Cardiovascular:  Negative for chest pain.  Gastrointestinal:  Negative for abdominal pain.  Genitourinary:  Negative for flank pain.  Musculoskeletal:  Negative for back pain.  Skin:  Negative for rash.   Physical Exam Updated Vital Signs BP (!) 155/68 (BP Location: Left Arm)   Pulse 87  Temp 98.6 F (37 C)   Resp 17   Ht 5\' 1"  (1.549 m)   Wt 68 kg   SpO2 95%   BMI 28.34 kg/m   Physical Exam Constitutional:      Appearance: Normal appearance.  HENT:     Head: Normocephalic.     Nose: Nose normal.  Eyes:     Extraocular Movements: Extraocular movements intact.  Cardiovascular:     Rate and Rhythm: Normal rate.  Pulmonary:     Effort: Pulmonary effort is normal.  Musculoskeletal:        General: Normal range of motion.     Cervical back: Normal range of motion.  Neurological:     Mental Status: She is alert. Mental status is at baseline.     Comments: Patient awake and alert answering all questions.  She has baseline right upper extremity flaccid weakness per family.  Otherwise moving left upper extremity and bilateral lower extremities with 5/5 strength.  Cranial nerves II through XII intact.    ED Results /  Procedures / Treatments   Labs (all labs ordered are listed, but only abnormal results are displayed) Labs Reviewed  CBC WITH DIFFERENTIAL/PLATELET - Abnormal; Notable for the following components:      Result Value   Hemoglobin 11.9 (*)    All other components within normal limits  RESP PANEL BY RT-PCR (FLU A&B, COVID) ARPGX2  AMMONIA  COMPREHENSIVE METABOLIC PANEL  URINALYSIS, ROUTINE W REFLEX MICROSCOPIC  TROPONIN I (HIGH SENSITIVITY)  TROPONIN I (HIGH SENSITIVITY)    EKG EKG Interpretation  Date/Time:  Monday April 11 2021 21:24:17 EDT Ventricular Rate:  88 PR Interval:  146 QRS Duration: 72 QT Interval:  614 QTC Calculation: 742 R Axis:   41 Text Interpretation: Normal sinus rhythm Low voltage QRS Nonspecific ST and T wave abnormality Prolonged QT Abnormal ECG Confirmed by [id number] ([id number]) on 04/11/2021 10:46:59 PM  Radiology CT Head Wo Contrast  Result Date: 04/11/2021 CLINICAL DATA:  Status post fall EXAM: CT HEAD WITHOUT CONTRAST CT CERVICAL SPINE WITHOUT CONTRAST TECHNIQUE: Multidetector CT imaging of the head and cervical spine was performed following the standard protocol without intravenous contrast. Multiplanar CT image reconstructions of the cervical spine were also generated. COMPARISON:  MRI head 03/14/2021 FINDINGS: CT HEAD FINDINGS Brain: No evidence of large-territorial acute infarction. Right frontal cortex 8 mm intraparenchymal hemorrhage. Bilateral frontal small volume subarachnoid hemorrhage. Right cerebellar subarachnoid hemorrhage. Trace anterior thickening/hyperdensity of the falx cerebral on the right consistent with a parafalcine subdural hematoma. No mass lesion. No mass effect or midline shift. No hydrocephalus. Basilar cisterns are patent. Vascular: No hyperdense vessel. Atherosclerotic calcifications are present within the cavernous internal carotid arteries. Skull: Nondisplaced right occipital skull fracture with extension to the jugular foramen. Focal  lesion. Sinuses/Orbits: Paranasal sinuses and mastoid air cells are clear. Bilateral lens replacement. Otherwise the orbits are unremarkable. Other: None. CT CERVICAL SPINE FINDINGS Alignment: Normal. Skull base and vertebrae: Multilevel degenerative changes of the spine. No acute fracture. No aggressive appearing focal osseous lesion or focal pathologic process. Soft tissues and spinal canal: No prevertebral fluid or swelling. No visible canal hematoma. Upper chest: Unremarkable. Other: None. IMPRESSION: 1. Right frontal cortex 8 mm intraparenchymal hemorrhage. 2. Bilateral frontal small volume subarachnoid hemorrhage. 3. Right cerebellar subarachnoid hemorrhage. 4. Trace right parafalcine subdural hematoma anteriorly. 5. Nondisplaced right occipital skull fracture that extends to the jugular foramen. 6. No acute displaced fracture or traumatic listhesis of the cervical spine. These results were called  by telephone at the time of interpretation on 04/11/2021 at 10:13 pm to provider Dr. Effie ShyWentz, who verbally acknowledged these results. Electronically Signed   By: Tish FredericksonMorgane  Naveau M.D.   On: 04/11/2021 22:17   CT Cervical Spine Wo Contrast  Result Date: 04/11/2021 CLINICAL DATA:  Status post fall EXAM: CT HEAD WITHOUT CONTRAST CT CERVICAL SPINE WITHOUT CONTRAST TECHNIQUE: Multidetector CT imaging of the head and cervical spine was performed following the standard protocol without intravenous contrast. Multiplanar CT image reconstructions of the cervical spine were also generated. COMPARISON:  MRI head 03/14/2021 FINDINGS: CT HEAD FINDINGS Brain: No evidence of large-territorial acute infarction. Right frontal cortex 8 mm intraparenchymal hemorrhage. Bilateral frontal small volume subarachnoid hemorrhage. Right cerebellar subarachnoid hemorrhage. Trace anterior thickening/hyperdensity of the falx cerebral on the right consistent with a parafalcine subdural hematoma. No mass lesion. No mass effect or midline shift. No  hydrocephalus. Basilar cisterns are patent. Vascular: No hyperdense vessel. Atherosclerotic calcifications are present within the cavernous internal carotid arteries. Skull: Nondisplaced right occipital skull fracture with extension to the jugular foramen. Focal lesion. Sinuses/Orbits: Paranasal sinuses and mastoid air cells are clear. Bilateral lens replacement. Otherwise the orbits are unremarkable. Other: None. CT CERVICAL SPINE FINDINGS Alignment: Normal. Skull base and vertebrae: Multilevel degenerative changes of the spine. No acute fracture. No aggressive appearing focal osseous lesion or focal pathologic process. Soft tissues and spinal canal: No prevertebral fluid or swelling. No visible canal hematoma. Upper chest: Unremarkable. Other: None. IMPRESSION: 1. Right frontal cortex 8 mm intraparenchymal hemorrhage. 2. Bilateral frontal small volume subarachnoid hemorrhage. 3. Right cerebellar subarachnoid hemorrhage. 4. Trace right parafalcine subdural hematoma anteriorly. 5. Nondisplaced right occipital skull fracture that extends to the jugular foramen. 6. No acute displaced fracture or traumatic listhesis of the cervical spine. These results were called by telephone at the time of interpretation on 04/11/2021 at 10:13 pm to provider Dr. Effie ShyWentz, who verbally acknowledged these results. Electronically Signed   By: Tish FredericksonMorgane  Naveau M.D.   On: 04/11/2021 22:17    Procedures .Critical Care  Date/Time: 04/11/2021 11:05 PM Performed by: Cheryll CockayneHong, Killian Ress S, MD Authorized by: Cheryll CockayneHong, Naylea Wigington S, MD   Critical care provider statement:    Critical care time (minutes):  30   Critical care time was exclusive of:  Separately billable procedures and treating other patients   Critical care was necessary to treat or prevent imminent or life-threatening deterioration of the following conditions:  CNS failure or compromise   Medications Ordered in ED Medications  clevidipine (CLEVIPREX) infusion 0.5 mg/mL (has no  administration in time range)  levETIRAcetam (KEPPRA) IVPB 1000 mg/100 mL premix (has no administration in time range)  ondansetron (ZOFRAN) injection 4 mg (4 mg Intravenous Given 04/11/21 2300)    ED Course  I have reviewed the triage vital signs and the nursing notes.  Pertinent labs & imaging results that were available during my care of the patient were reviewed by me and considered in my medical decision making (see chart for details).  Clinical Course as of 04/11/21 2305  Mon Apr 11, 2021  2133 Called CT scan to expedite scan.  [EH]    Clinical Course User Index [EH] Cristina GongHammond, Elizabeth W, PA-C   MDM Rules/Calculators/A&P                          CT imaging concerning for subarachnoid hemorrhage and intraparenchymal hemorrhage.  IV Cleviprex started for blood pressure management, IV Keppra ordered and IV Zofran ordered  as well.  Patient is not on any blood thinning or anticoagulate medication.  Consultation to neurosurgery.   Final Clinical Impression(s) / ED Diagnoses Final diagnoses:  SAH (subarachnoid hemorrhage) (HCC)    Rx / DC Orders ED Discharge Orders     None        Cheryll Cockayne, MD 04/11/21 2305

## 2021-04-11 NOTE — H&P (Addendum)
Date: 04/12/2021               Patient Name:  Alexandria Potts MRN: [id number]  DOB: [birth year] Age / Sex: 80 y.o., female   PCP: Pcp, No         Medical Service: Internal Medicine Teaching Service         Attending Physician: Dr. Mikey Bussing, Marthenia Rolling, DO    First Contact: Karsten Ro, MD Pager: [phone]  Second Contact: Thurmon Fair, MD Pager: Cecilie Kicks [phone]       After Hours (After 5p/  First Contact Pager: [phone]  weekends / holidays): Second Contact Pager: [phone]   SUBJECTIVE   Chief Complaint: Fall   History of Present Illness: Alexandria Potts is a 80 y.o. female with a pertinent PMH of hypertension, hypothyroidism, who presents to [facility] with after a fall.  The majority of the history was provided by the patient's daughter who was at bedside.  She states that she just found out the patient may have had a fall last week around Wednesday or Thursday.  She believes that the patient may have fallen off her toilet in the middle of the night.  Since then she has not had any significant symptoms other than this past Saturday.  At that time she was experiencing more sleepiness as result had to take a nap.  Although she states that her mom does suffer from restless leg syndrome and is often up at night due to her symptoms, it is atypical for her to be that sleepy.  Otherwise, the patient was asymptomatic for the majority of the day today leading up until her fall.  The fall happened at around 5 PM when the patient and her husband were walking out of the house.  The patient was found to collapsed suddenly and was unconscious for short period of time.  The patient's husband shook her and she slowly regained consciousness.  EMS was called and the patient was taken to [facility] ED. Since her fall, the patient has been experiencing increased sleepiness, headache, and dizziness that is persistent but worse with movement.  She does have right arm weakness, but this is chronic and currently being worked up in  the outpatient setting at [facility] for possible anterior horn disease/ALS. Otherwise, the patient does not have any focal neurologic deficits and currently has a GCS of 15.  In the ED, patient had a CT scan without contrast that showed evidence of nondisplaced occipital fracture complicated by subdural hematoma, subarachnoid hemorrhage, and intraparenchymal hemorrhage.  Neurosurgery was consulted in the ED.   Past Medical History:  Past Medical History:  Diagnosis Date   Carpal tunnel syndrome    right   Hypothyroidism     Medications: Current Meds  Medication Sig   amLODipine (NORVASC) 5 MG tablet Take 5 mg by mouth daily.   fluticasone (FLONASE) 50 MCG/ACT nasal spray Place 1 spray into both nostrils daily as needed for rhinitis.   latanoprost (XALATAN) 0.005 % ophthalmic solution Place 1 drop into both eyes daily.   levothyroxine (SYNTHROID) 75 MCG tablet Take 75 mcg by mouth daily.   metFORMIN (GLUCOPHAGE-XR) 500 MG 24 hr tablet Take 500 mg by mouth daily.    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   OBJECTIVE:   Physical Exam: Blood pressure (!) 119/56, pulse 86, temperature 98.6 F (37 C), resp. rate 16, height 5\' 1"  (1.549 m), weight 68 kg, SpO2 96 %. Physical Exam Constitutional:      General: She is not in acute distress.    Appearance: She is not ill-appearing.      Comments: Sleepy and keeps her eyes closed due to light sensitivity  HENT:     Head:     Comments: Right occipital scull fracture    Mouth/Throat:     Mouth: Mucous membranes are moist.     Pharynx: Oropharynx is clear.  Eyes:     Extraocular Movements: Extraocular movements intact.     Pupils: Pupils are equal, round, and reactive to light.  Cardiovascular:     Rate and Rhythm: Normal rate and regular rhythm.     Pulses: Normal pulses.     Heart sounds: Normal heart sounds.  Pulmonary:     Effort: Pulmonary effort is normal.     Breath sounds: Normal breath sounds.  Abdominal:     General: Bowel sounds are normal. There is no distension.     Tenderness: There is no abdominal tenderness.  Musculoskeletal:        General: No swelling or deformity. Normal range of motion.     Cervical back: Normal range of motion. No rigidity.  Lymphadenopathy:     Cervical: No cervical adenopathy.  Skin:    General: Skin is warm and dry.  Neurological:     General: No focal deficit present.     Mental Status: She is alert and oriented to person, place, and time. Mental status is at baseline.     Motor: Weakness (chronic right arm weakness 3/5) present.  Psychiatric:        Mood and Affect: Mood normal.    Labs: CBC    Component Value Date/Time   WBC 8.6 04/11/2021 2142   RBC 3.95 04/11/2021 2142   HGB 11.9 (L) 04/11/2021 2142   HGB 13.1 02/24/2021 0913   HCT 36.8 04/11/2021 2142   HCT 39.8 02/24/2021 0913   PLT 160 04/11/2021 2142   PLT 183 02/24/2021 0913   MCV 93.2 04/11/2021 2142   MCV 91 02/24/2021 0913   MCH 30.1 04/11/2021 2142   MCHC 32.3 04/11/2021 2142   RDW 13.3 04/11/2021 2142   RDW 13.1 02/24/2021 0913   LYMPHSABS 0.8 04/11/2021 2142   LYMPHSABS 1.6 02/24/2021 0913   MONOABS 0.5 04/11/2021 2142   EOSABS 0.0 04/11/2021 2142   EOSABS 0.2 02/24/2021 0913   BASOSABS 0.0 04/11/2021 2142   BASOSABS 0.1 02/24/2021 0913     CMP     Component Value Date/Time   NA  136 04/11/2021 2142   NA 142 02/24/2021 0913   K 4.0 04/11/2021 2142   CL 102 04/11/2021 2142   CO2 25 04/11/2021 2142   GLUCOSE 177 (H) 04/11/2021 2142   BUN 15 04/11/2021 2142   BUN 14 02/24/2021 0913   CREATININE 0.68 04/11/2021 2142   CALCIUM 9.0 04/11/2021 2142   PROT 7.1 04/11/2021  2142   PROT 7.1 02/24/2021 0913   ALBUMIN 3.4 (L) 04/11/2021 2142   ALBUMIN 4.6 02/24/2021 0913   AST 23 04/11/2021 2142   ALT 22 04/11/2021 2142   ALKPHOS 59 04/11/2021 2142   BILITOT 0.4 04/11/2021 2142   BILITOT 0.5 02/24/2021 0913   GFRNONAA >60 04/11/2021 2142    Imaging: CT Head Wo Contrast 1. Right frontal cortex 8 mm intraparenchymal hemorrhage.  2. Bilateral frontal small volume subarachnoid hemorrhage.  3. Right cerebellar subarachnoid hemorrhage.  4. Trace right parafalcine subdural hematoma anteriorly.  5. Nondisplaced right occipital skull fracture that extends to the jugular foramen.  6. No acute displaced fracture or traumatic listhesis of the cervical spine.   EKG: personally reviewed my interpretation is normal sinus  ASSESSMENT & PLAN:   Principal Problem:   Fracture of occipital bone of skull with loss of consciousness (HCC) Active Problems:   Subdural hematoma (HCC)   Subarachnoid hemorrhage (HCC)   Intraparenchymal hemorrhage of brain (HCC)   Hypothyroidism   Alexandria Potts is a 80 y.o. with pertinent PMH of prediabetes, hypothyroidism who presented after a fall  and admit for nondisplaced occipital skull fracture complicated by intraparenchymal hemorrhage, subdural hematoma, and subarachnoid hemorrhage on hospital day 0  #Fall with nondisplaced occipital skull fracture  #Intraparenchymal hemorrhage, 12mm Rt frontal cortex #Subdural hematoma, trace anterior Rt parafalcine  #Subarachnoid hemorrhage, small bilateral frontal & Rt cerebellar  Patient presents with acute SAH, SDH, and IPH. She has a GCS of 15 but has the following symptoms: sleepy appearing,  headache, and nausea. She is alert and oriented and following commands appropriately and her neurovascular exam is at baseline. Neurosurgery was consulted in the ED and states that there is no emergent indication for surgical evacuation. There are not clinical signs of increased ICP. There is no indication for seizure prophylaxis. Goal BP is <140 SBP. She is not on anticoagulation or antiplatelet therapy.  - Serial neurovascular exam q2 hours for the first 24 hours - Monitor for progression with repeat CT wo contrast  - Start labetalol 5 mg IV PRN to maintain SBP < 140. - CBG monitoring q 4 hours for the first 24 hours - Will give oxycodone IR 2.5 mg q4 hours for pain and zofran for nausea - Strict bed rest with pneumatic compression for DVT ppx. - Start a bowel regimen of Senakot daily  - PT/OT consulted - TOC consulted for likely SNF placement  - Appreciate neurosurgeries assistance with this patient.   Hypertension: GBP currently 119/56. Goal SBP < 140. Will restart home medications tomorrow. - Will likely need to restart amlodipine 5 mg tomorrow - Continue labetalol 5 mg IV q 2 hours PRM SBP > 140  #Hypothyroidism: - Continue home dose of levothyroxine   #Prediabetes: - CBG monitoring q4 hours  Diet: Normal VTE: SCDs IVF: None,None Code: Full  Prior to Admission Living Arrangement: Home, living husband Anticipated Discharge Location: SNF Barriers to Discharge: stable intracranial imagining   Dispo: Admit patient to Inpatient with expected length of stay greater than 2 midnights.  Signed: Chari Manning, D.O.  Internal Medicine Resident, PGY-2 [facility]  Pager: [phone] 1:35 AM, 04/12/2021   Please contact the on call pager after 5 pm and on weekends at [phone].

## 2021-04-11 NOTE — ED Provider Notes (Signed)
Care assumed from Dr. Audley Hose, patient with intracranial bleeding pending neurosurgical consultation.  Neurosurgery consult appreciated.  They recommend admission to medicine service and will follow patient and repeat scan in the morning.  Case is discussed with Dr. Marchia Bond of internal medicine teaching service, who agrees to admit the patient.   Dione Booze, MD 04/12/21 0000

## 2021-04-11 NOTE — ED Triage Notes (Signed)
Per ems, pt from home. Pt reports possible syncopal episode. Pt was found face down on the floor but awake. Pt has been having weakness and lethargy since Friday. Pt had a fall Thursday night in the bathroom. Pt started gabapentin a few days ago. Pt also reporting nausea and headache. EMS VSS. A&Ox4.

## 2021-04-11 NOTE — ED Provider Notes (Signed)
Received call from radiologist, who is looking at her head CT.  Patient has subarachnoid blood, and brain contusions.  Cervical spine images do not indicate fracture.   Patient currently in the waiting room.  I will ask charge nurse to room her as soon as possible, due to the serious nature of these findings.   Mancel Bale, MD 04/11/21 2214

## 2021-04-11 NOTE — Hospital Course (Signed)
Fall - subdural hematoma  ALS evaluation  Neurosurgery

## 2021-04-11 NOTE — ED Provider Notes (Signed)
Emergency Medicine Provider Triage Evaluation Note  Alexandria Potts , a 80 y.o. female  was evaluated in triage.  Pt complains of AMS.  History provided by daughter.  Patient fell face forward today.  She was not on the ground for more than 20 minutes. She has been weak and generally not her self since she had a fall Thursday night in the bathroom.  No new weakness. She has vomited.  Review of Systems  Positive: Headache, fall in bathroom, now syncope vs fall face forward, not on ground for more than 20 minutes. Vomiting Negative: fevers  Physical Exam  BP (!) 155/68 (BP Location: Left Arm)   Pulse 87   Temp 98.6 F (37 C)   Resp 17   Ht 5\' 1"  (1.549 m)   Wt 68 kg   SpO2 95%   BMI 28.34 kg/m  Gen:   Somnolent.  Wakes to voice, is able to answer questions Resp:  Normal effort  MSK:   Moves extremities without difficulty except for right arm which is baseline weak. Other:  Speech is not slurred.  Able to lift bilateral legs off bed.  Pupils equal.   Medical Decision Making  Medically screening exam initiated at 10:32 PM.  Appropriate orders placed.  Alexandria Potts was informed that the remainder of the evaluation will be completed by another provider, this initial triage assessment does not replace that evaluation, and the importance of remaining in the ED until their evaluation is complete.  While patient does appear to be more sleepy since this second fall according to family it is not clear if she had a syncopal event causing her fall and being more sleepy vs if the vomiting and lethargy occurred as a result from head trauma.    She does not appear to be a leveled trauma at this time as it is unclear the cause of her lethargy and she has been abnormal since the fall 4 days ago.   However given her mental status change I called CT scan to expedite scan. After I saw patient and she was triaged, labs were drawn and she was taken immediately to CT scan.     Rolin Barry 04/11/21 2238    2239, MD 04/12/21 [phone]

## 2021-04-12 ENCOUNTER — Inpatient Hospital Stay (HOSPITAL_COMMUNITY): Payer: Medicare Other

## 2021-04-12 DIAGNOSIS — R402252 Coma scale, best verbal response, oriented, at arrival to emergency department: Secondary | ICD-10-CM | POA: Diagnosis present

## 2021-04-12 DIAGNOSIS — Z800 Family history of malignant neoplasm of digestive organs: Secondary | ICD-10-CM | POA: Diagnosis not present

## 2021-04-12 DIAGNOSIS — Z87891 Personal history of nicotine dependence: Secondary | ICD-10-CM | POA: Diagnosis not present

## 2021-04-12 DIAGNOSIS — W19XXXA Unspecified fall, initial encounter: Secondary | ICD-10-CM | POA: Diagnosis present

## 2021-04-12 DIAGNOSIS — S066X9A Traumatic subarachnoid hemorrhage with loss of consciousness of unspecified duration, initial encounter: Secondary | ICD-10-CM | POA: Diagnosis present

## 2021-04-12 DIAGNOSIS — I609 Nontraumatic subarachnoid hemorrhage, unspecified: Secondary | ICD-10-CM

## 2021-04-12 DIAGNOSIS — Z90710 Acquired absence of both cervix and uterus: Secondary | ICD-10-CM | POA: Diagnosis not present

## 2021-04-12 DIAGNOSIS — S065X9A Traumatic subdural hemorrhage with loss of consciousness of unspecified duration, initial encounter: Secondary | ICD-10-CM | POA: Diagnosis present

## 2021-04-12 DIAGNOSIS — S069X9A Unspecified intracranial injury with loss of consciousness of unspecified duration, initial encounter: Secondary | ICD-10-CM | POA: Diagnosis not present

## 2021-04-12 DIAGNOSIS — Z20822 Contact with and (suspected) exposure to covid-19: Secondary | ICD-10-CM | POA: Diagnosis present

## 2021-04-12 DIAGNOSIS — Z79899 Other long term (current) drug therapy: Secondary | ICD-10-CM | POA: Diagnosis not present

## 2021-04-12 DIAGNOSIS — Z833 Family history of diabetes mellitus: Secondary | ICD-10-CM | POA: Diagnosis not present

## 2021-04-12 DIAGNOSIS — R402142 Coma scale, eyes open, spontaneous, at arrival to emergency department: Secondary | ICD-10-CM | POA: Diagnosis present

## 2021-04-12 DIAGNOSIS — E039 Hypothyroidism, unspecified: Secondary | ICD-10-CM

## 2021-04-12 DIAGNOSIS — E1140 Type 2 diabetes mellitus with diabetic neuropathy, unspecified: Secondary | ICD-10-CM | POA: Diagnosis present

## 2021-04-12 DIAGNOSIS — I619 Nontraumatic intracerebral hemorrhage, unspecified: Secondary | ICD-10-CM

## 2021-04-12 DIAGNOSIS — R402362 Coma scale, best motor response, obeys commands, at arrival to emergency department: Secondary | ICD-10-CM | POA: Diagnosis present

## 2021-04-12 DIAGNOSIS — S065XAA Traumatic subdural hemorrhage with loss of consciousness status unknown, initial encounter: Secondary | ICD-10-CM | POA: Diagnosis present

## 2021-04-12 DIAGNOSIS — I10 Essential (primary) hypertension: Secondary | ICD-10-CM | POA: Diagnosis present

## 2021-04-12 DIAGNOSIS — S02119A Unspecified fracture of occiput, initial encounter for closed fracture: Secondary | ICD-10-CM | POA: Diagnosis present

## 2021-04-12 DIAGNOSIS — H9190 Unspecified hearing loss, unspecified ear: Secondary | ICD-10-CM | POA: Diagnosis present

## 2021-04-12 DIAGNOSIS — K5900 Constipation, unspecified: Secondary | ICD-10-CM | POA: Diagnosis not present

## 2021-04-12 LAB — BASIC METABOLIC PANEL
Anion gap: 10 (ref 5–15)
BUN: 16 mg/dL (ref 8–23)
CO2: 24 mmol/L (ref 22–32)
Calcium: 9 mg/dL (ref 8.9–10.3)
Chloride: 103 mmol/L (ref 98–111)
Creatinine, Ser: 0.67 mg/dL (ref 0.44–1.00)
GFR, Estimated: >60 mL/min (ref >60)
Glucose, Bld: 189 mg/dL — ABNORMAL HIGH (ref 70–99)
Potassium: 4.3 mmol/L (ref 3.5–5.1)
Sodium: 137 mmol/L (ref 135–145)

## 2021-04-12 LAB — MRSA NEXT GEN BY PCR, NASAL: MRSA by PCR Next Gen: NOT DETECTED

## 2021-04-12 LAB — URINALYSIS, ROUTINE W REFLEX MICROSCOPIC
Bilirubin Urine: NEGATIVE
Glucose, UA: NEGATIVE mg/dL
Hgb urine dipstick: NEGATIVE
Ketones, ur: NEGATIVE mg/dL
Leukocytes,Ua: NEGATIVE
Nitrite: NEGATIVE
Protein, ur: NEGATIVE mg/dL
Specific Gravity, Urine: 1.014 (ref 1.005–1.030)
pH: 7 (ref 5.0–8.0)

## 2021-04-12 LAB — CBG MONITORING, ED: Glucose-Capillary: 105 mg/dL — ABNORMAL HIGH (ref 70–99)

## 2021-04-12 LAB — GLUCOSE, CAPILLARY
Glucose-Capillary: 123 mg/dL — ABNORMAL HIGH (ref 70–99)
Glucose-Capillary: 122 mg/dL — ABNORMAL HIGH (ref 70–99)
Glucose-Capillary: 109 mg/dL — ABNORMAL HIGH (ref 70–99)

## 2021-04-12 LAB — RESP PANEL BY RT-PCR (FLU A&B, COVID) ARPGX2
Influenza A by PCR: NEGATIVE
Influenza B by PCR: NEGATIVE
SARS Coronavirus 2 by RT PCR: NEGATIVE

## 2021-04-12 LAB — CBC
HCT: 39.2 % (ref 36.0–46.0)
Hemoglobin: 13 g/dL (ref 12.0–15.0)
MCH: 30.7 pg (ref 26.0–34.0)
MCHC: 33.2 g/dL (ref 30.0–36.0)
MCV: 92.7 fL (ref 80.0–100.0)
Platelets: 141 10*3/uL — ABNORMAL LOW (ref 150–400)
RBC: 4.23 MIL/uL (ref 3.87–5.11)
RDW: 13.4 % (ref 11.5–15.5)
WBC: 7 10*3/uL (ref 4.0–10.5)
nRBC: 0 % (ref 0.0–0.2)

## 2021-04-12 LAB — TROPONIN I (HIGH SENSITIVITY): Troponin I (High Sensitivity): 7 ng/L (ref <18)

## 2021-04-12 IMAGING — CT CT HEAD W/O CM
4 series · 15 of 47 positions shown, 17 images · non-contrast
Comparison: Head CT yesterday.

CLINICAL DATA: Follow-up intracerebral hemorrhage.

EXAM:
CT HEAD WITHOUT CONTRAST
TECHNIQUE: Contiguous axial images were obtained from the base of the skull
through the vertex without intravenous contrast.

[Series 3: head without · axial · non-contrast · 0.46mm/px · z∈[-157,-42]mm · 7 of 31 slices shown, 9 images]
[im 4/31  brain]
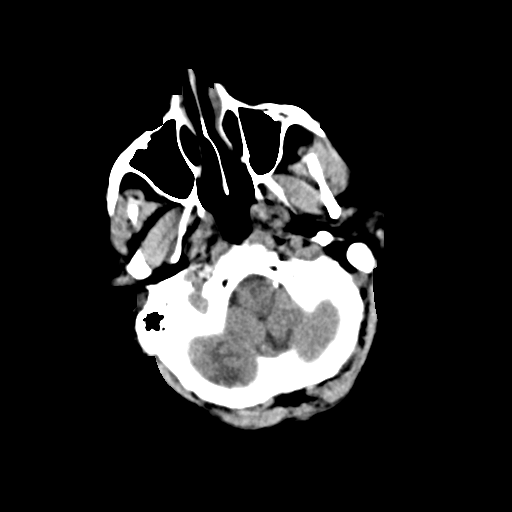
[im 4/31  bone]
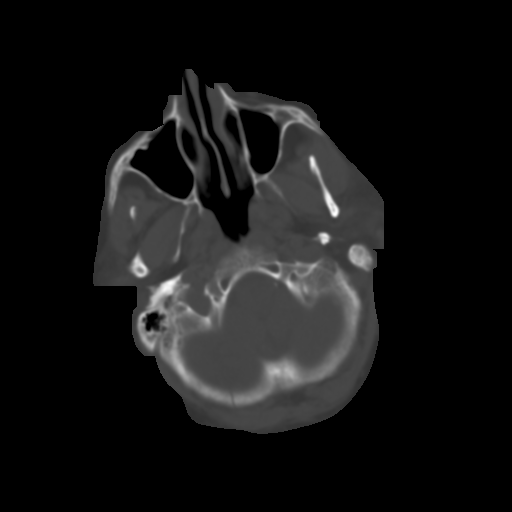
[im 8/31  brain]
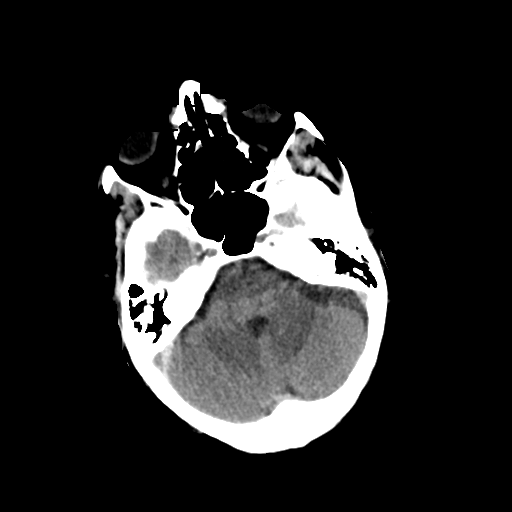
[im 12/31  brain]
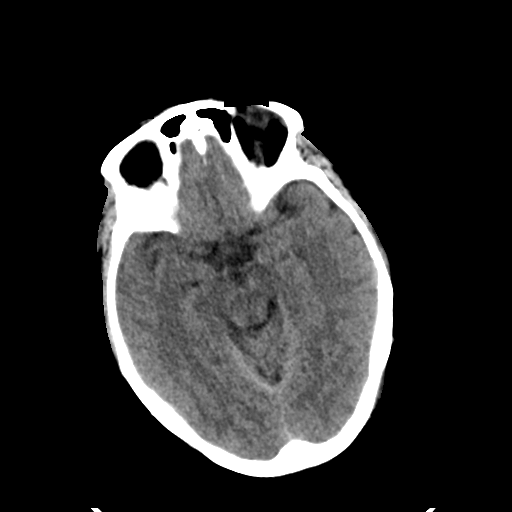
[im 16/31  brain]
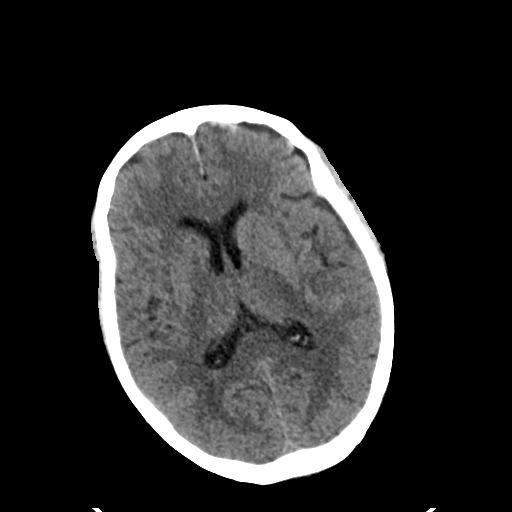
[im 19/31  brain]
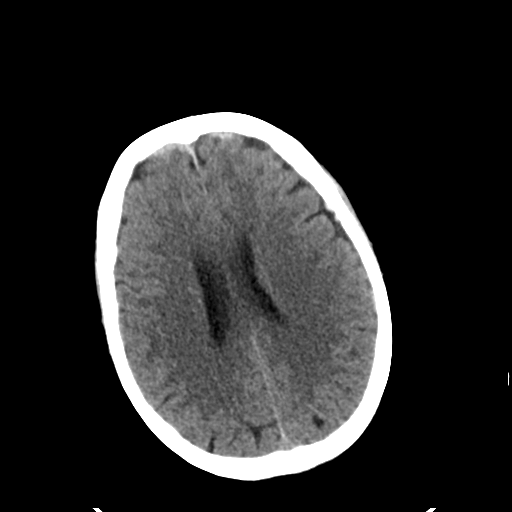
[im 19/31  bone]
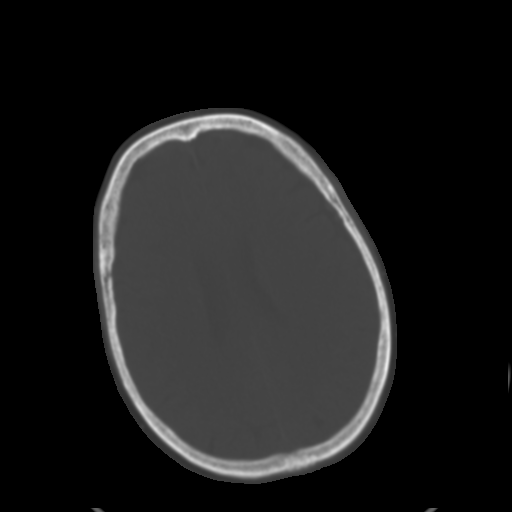
[im 23/31  brain]
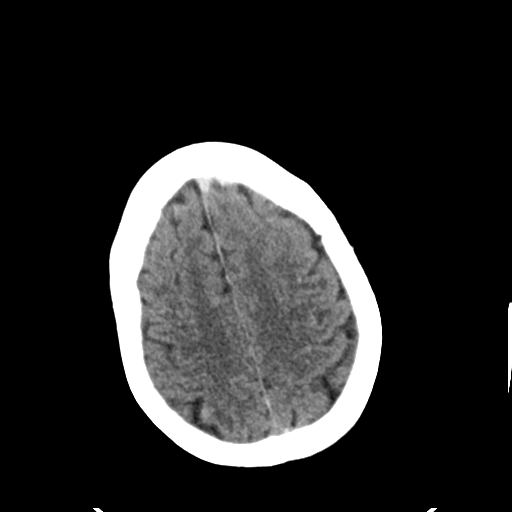
[im 27/31  brain]
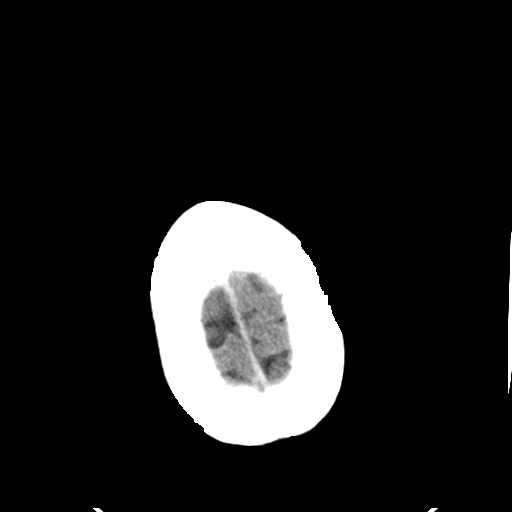

[Series 4: head bone · axial · 0.46mm/px · z∈[-158,-142]mm · 2 of 77 slices shown]
[im 8/77  bone]
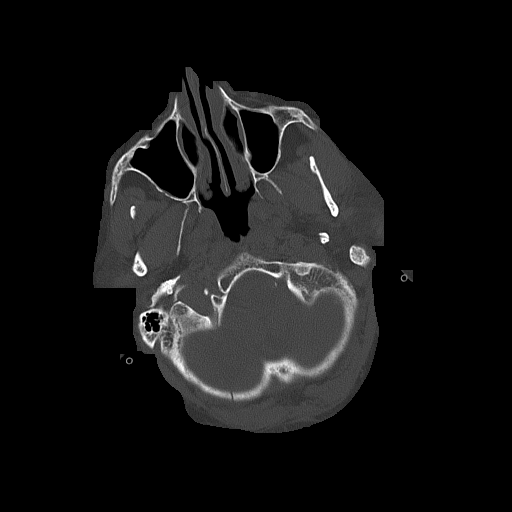
[im 16/77  bone]
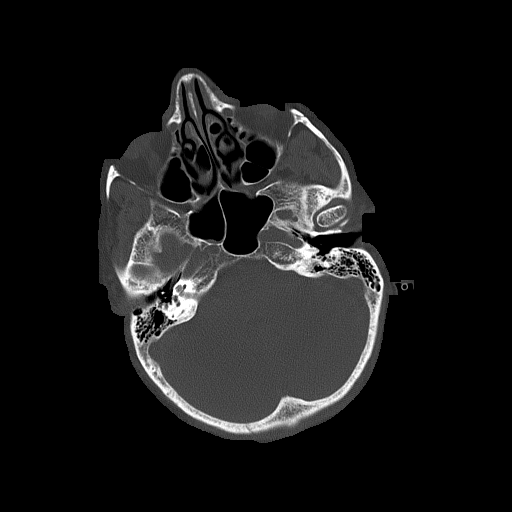

[Series 5: head without cor · coronal · non-contrast · 0.34mm/px · 3 of 66 slices shown]
[im 22/66  brain]
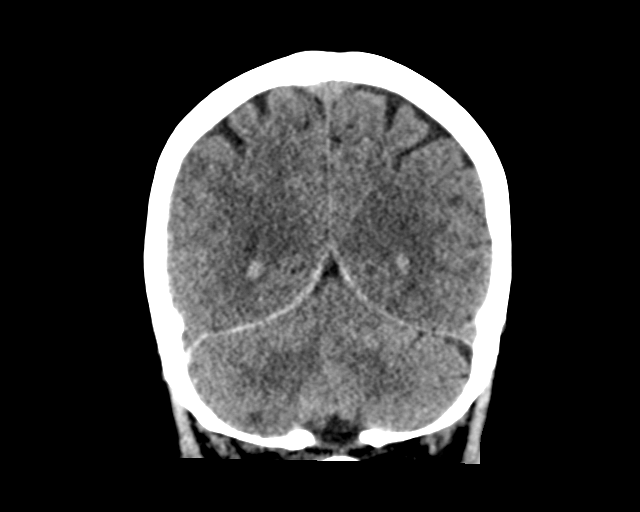
[im 29/66  brain]
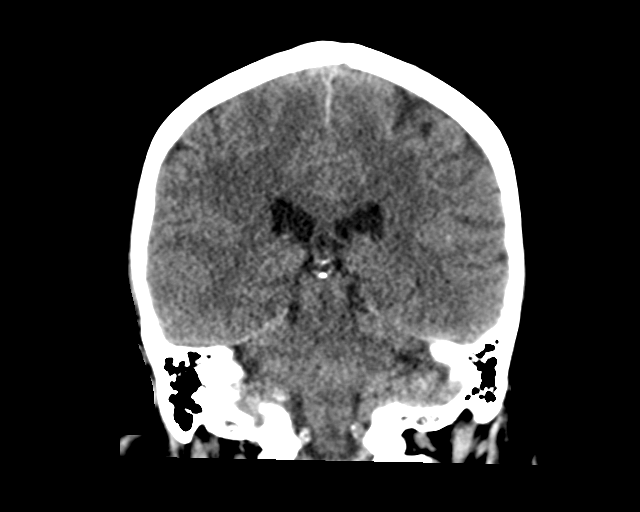
[im 37/66  brain]
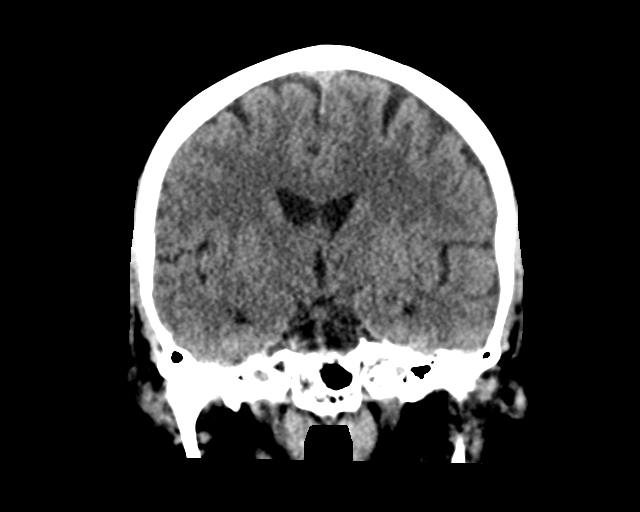

[Series 6: head without sag · sagittal · non-contrast · 0.30mm/px · 3 of 64 slices shown]
[im 22/64  brain]
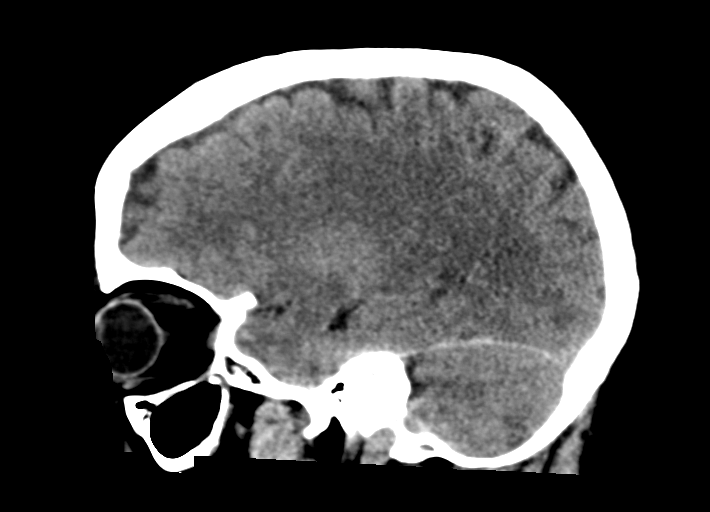
[im 32/64  brain]
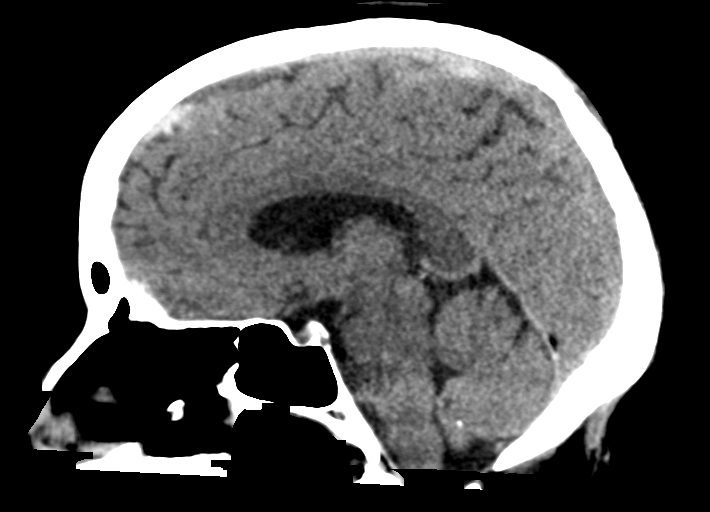
[im 43/64  brain]
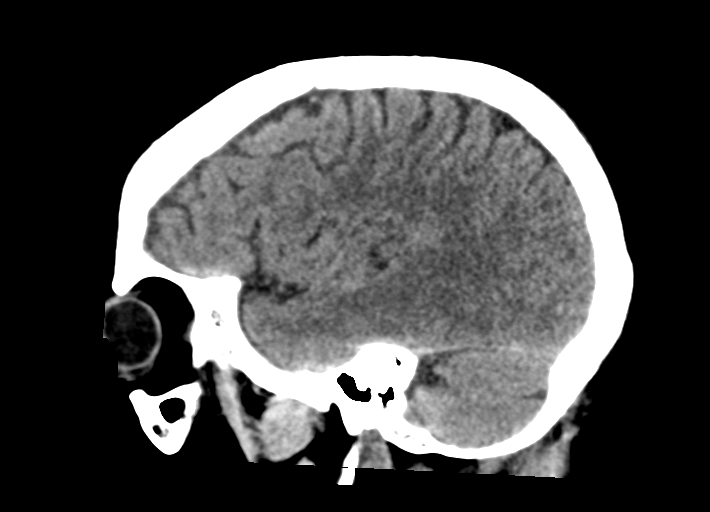

[15 of 47 positions shown; findings below may reference images not displayed]

FINDINGS: Brain: Small right frontal intraparenchymal hematoma measures 7.6 x
6.0 x 10 mm (volume = 240 mm^3), previously 7.8 x 6.3 x 9.6 cm
(volume = 250 cm^3). There is increase in surrounding edema from
prior exam. Adjacent right frontal as well as left frontal
subarachnoid hemorrhage is not significantly changed remains small
volume. The left frontal subarachnoid hemorrhage extends superiorly,
unchanged. There is likely a small amount of subdural blood tracking
along the right tentorium, this is unchanged. Small amount of blood
along the anterior falx is also not significantly changed. Trace
right cerebellar subarachnoid hemorrhage has improved. Layering
blood in the occipital horns the lateral ventricles, right greater
than left, increased from prior. No hydrocephalus. No acute infarct.
No midline shift.

Vascular: No hyperdense vessel.

Skull: Nondisplaced right occipital skull fracture again seen.
Fracture extends to the jugular foramen at the skull base.

Sinuses/Orbits: Trace mucosal thickening in right side of sphenoid
sinus no mastoid effusion bilateral cataract resection.

Other: None.
IMPRESSION: 1. Essentially unchanged size of small right frontal
intraparenchymal hematoma. Mild increased surrounding edema from
prior exam.
2. Unchanged bilateral frontal subarachnoid hemorrhage. Unchanged
small amount of subdural blood along the right tentorium and
anterior falx.
3. Increased layering blood in the occipital horns the lateral
ventricles, right greater than left. No hydrocephalus.
4. Nondisplaced right occipital skull fracture.

## 2021-04-12 MED ORDER — LABETALOL HCL 5 MG/ML IV SOLN: 5.0000 mg | INTRAVENOUS | Status: DC | PRN

## 2021-04-12 MED ORDER — AMLODIPINE BESYLATE 5 MG PO TABS
5.0000 mg | ORAL_TABLET | Freq: Every day | ORAL | Status: DC
  Administered 2021-04-12 – 2021-04-16 (×5): 5 mg via ORAL
  Filled 2021-04-12 (×5): qty 1

## 2021-04-12 MED ORDER — ONDANSETRON HCL 4 MG/2ML IJ SOLN
4.0000 mg | Freq: Four times a day (QID) | INTRAMUSCULAR | Status: DC | PRN
  Administered 2021-04-12 – 2021-04-16 (×7): 4 mg via INTRAVENOUS
  Filled 2021-04-12 (×7): qty 2

## 2021-04-12 MED ORDER — LEVOTHYROXINE SODIUM 75 MCG PO TABS
75.0000 ug | ORAL_TABLET | Freq: Every day | ORAL | Status: DC
  Administered 2021-04-12 – 2021-04-16 (×5): 75 ug via ORAL
  Filled 2021-04-12 (×5): qty 1

## 2021-04-12 MED ORDER — ACETAMINOPHEN 325 MG PO TABS
650.0000 mg | ORAL_TABLET | Freq: Four times a day (QID) | ORAL | Status: DC | PRN
  Administered 2021-04-12 – 2021-04-16 (×7): 650 mg via ORAL
  Filled 2021-04-12 (×8): qty 2

## 2021-04-12 MED ORDER — SENNOSIDES-DOCUSATE SODIUM 8.6-50 MG PO TABS
2.0000 | ORAL_TABLET | Freq: Every day | ORAL | Status: DC
  Administered 2021-04-12 (×2): 2 via ORAL
  Filled 2021-04-12 (×2): qty 2

## 2021-04-12 MED ORDER — SODIUM CHLORIDE 0.9% FLUSH
3.0000 mL | Freq: Two times a day (BID) | INTRAVENOUS | Status: DC
  Administered 2021-04-12 – 2021-04-16 (×7): 3 mL via INTRAVENOUS

## 2021-04-12 MED ORDER — OXYCODONE HCL 5 MG PO TABS
2.5000 mg | ORAL_TABLET | ORAL | Status: DC | PRN
  Administered 2021-04-12 – 2021-04-15 (×9): 2.5 mg via ORAL
  Filled 2021-04-12 (×10): qty 1

## 2021-04-12 MED ORDER — ACETAMINOPHEN 650 MG RE SUPP: 650.0000 mg | Freq: Four times a day (QID) | RECTAL | Status: DC | PRN

## 2021-04-12 MED ORDER — ONDANSETRON HCL 4 MG PO TABS
4.0000 mg | ORAL_TABLET | Freq: Four times a day (QID) | ORAL | Status: DC | PRN
  Administered 2021-04-15: 4 mg via ORAL
  Filled 2021-04-12: qty 1

## 2021-04-12 NOTE — Progress Notes (Addendum)
   Subjective:  Pt seen at rounds today. Pt c/o headache. Daughter states she is usually very independent except Rt hand weakness. They were planning to follow up at [facility] for weakness. Daughter states Pt is very sleepy now. Denies any complaints.  Objective:  Vital signs in last 24 hours: Vitals:   04/12/21 0345 04/12/21 0400 04/12/21 0415 04/12/21 0430  BP: (!) 124/55 (!) 118/58 (!) 120/53 (!) 122/57  Pulse: 73 73 71 77  Resp: (!) 21 (!) 23 (!) 22 (!) 22  Temp:      SpO2: 95% 96% 97% 96%  Weight:      Height:       Physical Exam Constitutional: Elderly lady lying on bed with eyes closed. not in acute distress. HEENT: No icterus, right occipital skull fracture Cardiovascular: Regular rate and rhythm, normal heart sounds, no murmurs, rubs, gallops. Pulmonary: Effort normal, breathing comfortably at room air effort normal.  Abdominal: Flat, non-distended Musculoskeletal: No peripheral edema Skin: warm and dry Neurological: Patient sleepy, keeps her eyes closed.  No dysarthria.  Mental status at baseline.  Chronic right arm weakness present (3/5) , 5/5 in all other extremities.  Psychiatric: normal mood and affect  Assessment/Plan:  Principal Problem:   Fracture of occipital bone of skull with loss of consciousness (HCC) Active Problems:   Subdural hematoma (HCC)   Subarachnoid hemorrhage (HCC)   Intraparenchymal hemorrhage of brain (HCC)   Hypothyroidism  Alexandria Potts is a 80 y.o. with pertinent PMH of prediabetes, hypothyroidism who presented after a fall  and admit for nondisplaced occipital skull fracture complicated by intraparenchymal hemorrhage, subdural hematoma, and subarachnoid hemorrhage on hospital day 0   #Fall with nondisplaced occipital skull fracture #Intraparenchymal hemorrhage, 19mm Rt frontal cortex #Subdural hematoma, trace anterior Rt parafalcine #Subarachnoid hemorrhage, small bilateral frontal & Rt cerebellar Patient was found facedown by her  daughter around Wednesday or Thursday.  CT scan with SAH, SDH, and IPH. Pt is still sleepy and continue to keep her eyes closed. C/o Headache and some nausea. She is following commands appropriately and her neuro exam is at baseline.  Neurosurgery consulted, no emergent indication for surgical evacuation.  Recommended repeat CT scan. - Neurosurgery consulted.  Appreciate recs. - Serial neurovascular exam.  - Follow-up CT head - Continue labetalol 5 mg IV PRN to maintain SBP < 140 - Continue oxycodone for pain and Zofran for nausea. - PT/OT eval and treat -TOC consulted for likely SNF placement.   Hypertension: BP 122/57 mmHg.  Goal SBP <140. -Started on home amlodipine this morning.  Will hold if becomes hypotensive. - Continue labetalol 5 mg IV q 2 hours PRM SBP > 140   #Hypothyroidism: - Continue  levothyroxine    #Prediabetes: - CBG monitoring q4 hours  Diet: Regular IVF: None VTE: SCDs Prior to Admission Living Arrangement: Home Anticipated Discharge Location: Likely Home Barriers to Discharge: Medical work-up Dispo: Anticipated discharge in approximately 1-2 day(s).   Karsten Ro, MD 04/12/2021, 6:10 AM Pager: [phone] After 5pm on weekdays and 1pm on weekends: Please call On Call pager [phone]

## 2021-04-12 NOTE — TOC CAGE-AID Note (Signed)
Transition of Care [facility]) - CAGE-AID Screening   Patient Details  Name: Alexandria Potts MRN: [id number] Date of Birth: [birth year]  [facility]) CM/SW Contact:    Janora Norlander, RN Phone Number: [phone] 04/12/2021, 11:29 AM   Clinical Narrative: Pt denies drug or alcohol use.   CAGE-AID Screening:    Have You Ever Felt You Ought to Cut Down on Your Drinking or Drug Use?: No Have People Annoyed You By Critizing Your Drinking Or Drug Use?: No Have You Felt Bad Or Guilty About Your Drinking Or Drug Use?: No Have You Ever Had a Drink or Used Drugs First Thing In The Morning to Steady Your Nerves or to Get Rid of a Hangover?: No CAGE-AID Score: 0  Substance Abuse Education Offered: No

## 2021-04-12 NOTE — Consult Note (Signed)
Reason for Consult:sdh and sah Referring Physician: EDP  Alexandria Potts is an 80 y.o. female.   HPI:  80 year old who fell last night and was found down by her daughter. DEnies any blood thinners. Patient resting comfortably in bed. Complaining of severe headaches but no NV dizziness or vision changes. Daughter states that she is being worked up by neurology for RUE weakness for the last two years.   Past Medical History:  Diagnosis Date   Carpal tunnel syndrome    right   Hypothyroidism     Past Surgical History:  Procedure Laterality Date   APPENDECTOMY  1995   CARPAL TUNNEL RELEASE Right 10/2019   ELBOW SURGERY Right 10/2019   HYSTERECTOMY     TONSILLECTOMY     as a child    Allergies  Allergen Reactions   Penicillins Rash   Statins Other (See Comments)    Leg cramps    Social History   Tobacco Use   Smoking status: Former    Packs/day: 0.50    Years: 3.00    Pack years: 1.50    Types: Cigarettes   Smokeless tobacco: Never   Tobacco comments:    in her early 82s  Substance Use Topics   Alcohol use: Never    Family History  Problem Relation Age of Onset   Heart Problems Mother    Other Mother        "lung problems"   Heart Problems Father    Other Father        "lung problems"   Diabetes Other        both sides      Review of Systems  Positive ROS: as above  All other systems have been reviewed and were otherwise negative with the exception of those mentioned in the HPI and as above.  Objective: Vital signs in last 24 hours: Temp:  [98.6 F (37 C)] 98.6 F (37 C) (06/20 2045) Pulse Rate:  [69-92] 69 (06/21 0715) Resp:  [16-24] 22 (06/21 0715) BP: (114-155)/(50-91) 119/53 (06/21 0715) SpO2:  [88 %-99 %] 95 % (06/21 0715) Weight:  [68 kg] 68 kg (06/20 2141)  General Appearance: Alert, cooperative, no distress, appears stated age Head: Normocephalic, without obvious abnormality, atraumatic Eyes: PERRL, conjunctiva/corneas clear, EOM's intact,  fundi benign, both eyes      Neck: Supple, symmetrical, trachea midline, no adenopathy; thyroid: No enlargement/tenderness/nodules; no carotid bruit or JVD Lungs: respirations unlabored Heart: Regular rate and rhythm,  Abdomen: Soft, non-tender, bowel sounds active all four quadrants, no masses, no organomegaly Extremities: Extremities normal, atraumatic, no cyanosis or edema Pulses: 2+ and symmetric all extremities Skin: Skin color, texture, turgor normal, no rashes or lesions  NEUROLOGIC:   Mental status: A&O x4, no aphasia, good attention span, Memory and fund of knowledge Motor Exam - grossly normal, normal tone and bulk, RUE flaccid- baseline.  Sensory Exam - grossly normal Reflexes: symmetric, no pathologic reflexes, No Hoffman's, No clonus Coordination - grossly normal Gait - not tested Balance - not tested Cranial Nerves: I: smell Not tested  II: visual acuity  OS: na    OD: na  II: visual fields Full to confrontation  II: pupils Equal, round, reactive to light  III,VII: ptosis None  III,IV,VI: extraocular muscles  Full ROM  V: mastication Normal  V: facial light touch sensation  Normal  V,VII: corneal reflex  Present  VII: facial muscle function - upper  Normal  VII: facial muscle function - lower Normal  VIII: hearing Not tested  IX: soft palate elevation  Normal  IX,X: gag reflex Present  XI: trapezius strength  5/5  XI: sternocleidomastoid strength 5/5  XI: neck flexion strength  5/5  XII: tongue strength  Normal    Data Review Lab Results  Component Value Date   WBC 7.0 04/12/2021   HGB 13.0 04/12/2021   HCT 39.2 04/12/2021   MCV 92.7 04/12/2021   PLT 141 (L) 04/12/2021   Lab Results  Component Value Date   NA 137 04/12/2021   K 4.3 04/12/2021   CL 103 04/12/2021   CO2 24 04/12/2021   BUN 16 04/12/2021   CREATININE 0.67 04/12/2021   GLUCOSE 189 (H) 04/12/2021   No results found for: INR, PROTIME  Radiology: CT Head Wo Contrast  Result Date:  04/11/2021 CLINICAL DATA:  Status post fall EXAM: CT HEAD WITHOUT CONTRAST CT CERVICAL SPINE WITHOUT CONTRAST TECHNIQUE: Multidetector CT imaging of the head and cervical spine was performed following the standard protocol without intravenous contrast. Multiplanar CT image reconstructions of the cervical spine were also generated. COMPARISON:  MRI head 03/14/2021 FINDINGS: CT HEAD FINDINGS Brain: No evidence of large-territorial acute infarction. Right frontal cortex 8 mm intraparenchymal hemorrhage. Bilateral frontal small volume subarachnoid hemorrhage. Right cerebellar subarachnoid hemorrhage. Trace anterior thickening/hyperdensity of the falx cerebral on the right consistent with a parafalcine subdural hematoma. No mass lesion. No mass effect or midline shift. No hydrocephalus. Basilar cisterns are patent. Vascular: No hyperdense vessel. Atherosclerotic calcifications are present within the cavernous internal carotid arteries. Skull: Nondisplaced right occipital skull fracture with extension to the jugular foramen. Focal lesion. Sinuses/Orbits: Paranasal sinuses and mastoid air cells are clear. Bilateral lens replacement. Otherwise the orbits are unremarkable. Other: None. CT CERVICAL SPINE FINDINGS Alignment: Normal. Skull base and vertebrae: Multilevel degenerative changes of the spine. No acute fracture. No aggressive appearing focal osseous lesion or focal pathologic process. Soft tissues and spinal canal: No prevertebral fluid or swelling. No visible canal hematoma. Upper chest: Unremarkable. Other: None. IMPRESSION: 1. Right frontal cortex 8 mm intraparenchymal hemorrhage. 2. Bilateral frontal small volume subarachnoid hemorrhage. 3. Right cerebellar subarachnoid hemorrhage. 4. Trace right parafalcine subdural hematoma anteriorly. 5. Nondisplaced right occipital skull fracture that extends to the jugular foramen. 6. No acute displaced fracture or traumatic listhesis of the cervical spine. These results  were called by telephone at the time of interpretation on 04/11/2021 at 10:13 pm to provider Dr. Effie Shy, who verbally acknowledged these results. Electronically Signed   By: Tish Frederickson M.D.   On: 04/11/2021 22:17   CT Cervical Spine Wo Contrast  Result Date: 04/11/2021 CLINICAL DATA:  Status post fall EXAM: CT HEAD WITHOUT CONTRAST CT CERVICAL SPINE WITHOUT CONTRAST TECHNIQUE: Multidetector CT imaging of the head and cervical spine was performed following the standard protocol without intravenous contrast. Multiplanar CT image reconstructions of the cervical spine were also generated. COMPARISON:  MRI head 03/14/2021 FINDINGS: CT HEAD FINDINGS Brain: No evidence of large-territorial acute infarction. Right frontal cortex 8 mm intraparenchymal hemorrhage. Bilateral frontal small volume subarachnoid hemorrhage. Right cerebellar subarachnoid hemorrhage. Trace anterior thickening/hyperdensity of the falx cerebral on the right consistent with a parafalcine subdural hematoma. No mass lesion. No mass effect or midline shift. No hydrocephalus. Basilar cisterns are patent. Vascular: No hyperdense vessel. Atherosclerotic calcifications are present within the cavernous internal carotid arteries. Skull: Nondisplaced right occipital skull fracture with extension to the jugular foramen. Focal lesion. Sinuses/Orbits: Paranasal sinuses and mastoid air cells are clear. Bilateral lens replacement. Otherwise the  orbits are unremarkable. Other: None. CT CERVICAL SPINE FINDINGS Alignment: Normal. Skull base and vertebrae: Multilevel degenerative changes of the spine. No acute fracture. No aggressive appearing focal osseous lesion or focal pathologic process. Soft tissues and spinal canal: No prevertebral fluid or swelling. No visible canal hematoma. Upper chest: Unremarkable. Other: None. IMPRESSION: 1. Right frontal cortex 8 mm intraparenchymal hemorrhage. 2. Bilateral frontal small volume subarachnoid hemorrhage. 3. Right  cerebellar subarachnoid hemorrhage. 4. Trace right parafalcine subdural hematoma anteriorly. 5. Nondisplaced right occipital skull fracture that extends to the jugular foramen. 6. No acute displaced fracture or traumatic listhesis of the cervical spine. These results were called by telephone at the time of interpretation on 04/11/2021 at 10:13 pm to provider Dr. Effie Shy, who verbally acknowledged these results. Electronically Signed   By: Tish Frederickson M.D.   On: 04/11/2021 22:17   DG Chest Port 1 View  Result Date: 04/11/2021 CLINICAL DATA:  Recent syncopal episode EXAM: PORTABLE CHEST 1 VIEW COMPARISON:  None. FINDINGS: Cardiac shadow is within normal limits. The lungs are well aerated bilaterally. Bibasilar atelectatic changes are seen. No acute bony abnormality is noted. IMPRESSION: Mild bibasilar atelectasis. Electronically Signed   By: Alcide Clever M.D.   On: 04/11/2021 23:48    Assessment/Plan: 80 year old female presented to the ED last night after being found down face forward by her daughter. Ct head shows a right frontal intraparenchymal hemorrhage with bilateral frontal small traumatic sah and trace right parafalcine sdh as well as a right occipital skull fracture, nondisplaced. Repeat head CT this morning. If stable, then likely then be discharged home with follow up in our office in a couple weeks.    Tiana Loft [facility] 04/12/2021 7:45 AM

## 2021-04-12 NOTE — ED Notes (Signed)
Provider at bedside

## 2021-04-13 LAB — GLUCOSE, CAPILLARY
Glucose-Capillary: 103 mg/dL — ABNORMAL HIGH (ref 70–99)
Glucose-Capillary: 92 mg/dL (ref 70–99)
Glucose-Capillary: 109 mg/dL — ABNORMAL HIGH (ref 70–99)
Glucose-Capillary: 96 mg/dL (ref 70–99)

## 2021-04-13 LAB — CBC
HCT: 32.7 % — ABNORMAL LOW (ref 36.0–46.0)
Hemoglobin: 11 g/dL — ABNORMAL LOW (ref 12.0–15.0)
MCH: 30.5 pg (ref 26.0–34.0)
MCHC: 33.6 g/dL (ref 30.0–36.0)
MCV: 90.6 fL (ref 80.0–100.0)
Platelets: 166 10*3/uL (ref 150–400)
RBC: 3.61 MIL/uL — ABNORMAL LOW (ref 3.87–5.11)
RDW: 13.2 % (ref 11.5–15.5)
WBC: 6.5 10*3/uL (ref 4.0–10.5)
nRBC: 0 % (ref 0.0–0.2)

## 2021-04-13 LAB — BASIC METABOLIC PANEL
Anion gap: 6 (ref 5–15)
BUN: 14 mg/dL (ref 8–23)
CO2: 29 mmol/L (ref 22–32)
Calcium: 8.6 mg/dL — ABNORMAL LOW (ref 8.9–10.3)
Chloride: 101 mmol/L (ref 98–111)
Creatinine, Ser: 0.6 mg/dL (ref 0.44–1.00)
GFR, Estimated: >60 mL/min (ref >60)
Glucose, Bld: 124 mg/dL — ABNORMAL HIGH (ref 70–99)
Potassium: 4.2 mmol/L (ref 3.5–5.1)
Sodium: 136 mmol/L (ref 135–145)

## 2021-04-13 LAB — T4, FREE: Free T4: 0.98 ng/dL (ref 0.61–1.12)

## 2021-04-13 NOTE — Progress Notes (Signed)
   Subjective:  Pt seen at rounds today.  Patient states she is feeling much better, still has some headache and nausea.  Patient states that she still feels weak.  Patient reassured that PT is going to help her today.  Patient agrees with the plan.  Objective:  Vital signs in last 24 hours: Vitals:   04/12/21 1952 04/12/21 2313 04/13/21 0252 04/13/21 0723  BP: (!) 111/53 (!) 124/50 132/70 (!) 113/48  Pulse: 67 64 68 66  Resp: 18 19 19 17   Temp: 98.9 F (37.2 C) 98.5 F (36.9 C) 98.4 F (36.9 C) 98.3 F (36.8 C)  TempSrc: Oral Oral Oral Oral  SpO2: 94% 96% 91% 94%  Weight:      Height:       Physical Exam Constitutional: Pleasant elderly lady lying on bed.  Interacting well.  Not in acute distress.  HEENT: No Icterus. Cardiovascular: Regular rate and rhythm, normal heart sounds, no murmurs/rubs/gallops. Pulmonary: Effort normal, breathing comfortably at room air  Musculoskeletal: No peripheral edema  Skin: Warm and dry  Neurological: Alert and oriented, no dysarthria.  Interacting well and answering questions appropriately.  Chronic right arm weakness present. No new focal deficits.   Psychiatric: normal mood and affect  Assessment/Plan:  Principal Problem:   Fracture of occipital bone of skull with loss of consciousness (HCC) Active Problems:   Subdural hematoma (HCC)   Subarachnoid hemorrhage (HCC)   Intraparenchymal hemorrhage of brain (HCC)   Hypothyroidism  Alexandria Potts is a 80 y.o. with pertinent PMH of prediabetes, hypothyroidism who presented after a fall  and admit for nondisplaced occipital skull fracture complicated by intraparenchymal hemorrhage, subdural hematoma, and subarachnoid hemorrhage on hospital day 1  #Fall with nondisplaced occipital skull fracture #Intraparenchymal hemorrhage, 43mm Rt frontal cortex #Subdural hematoma, trace anterior Rt parafalcine #Subarachnoid hemorrhage, small bilateral frontal & Rt cerebellar Patient complains of mild  headache and nausea. Repeat CT Head yesterday shows essentially unchanged size of small right frontal intraparenchymal hematoma. Mild increased surrounding edema. Unchanged bilateral frontal subarachnoid hemorrhage. Increased layering blood in the occipital horns the lateral ventricles, Rt >Lt. No hydrocephalus. Nondisplaced right occipital skull fracture. Hb decreased from 13 to 11.0. Neurosurgery signed off but want her to work with PT and OT to gain some strength before discharge.  Neurosurgery recommended to follow-up with Dr. 4m in 2 weeks.  PT/OT pending.  - Neurosurgery signed off.  Recommended to follow-up with Dr. Venetia Maxon in 2 weeks. - Continue labetalol 5 mg IV PRN to maintain SBP < 140 - Continue oxycodone for pain and Zofran for nausea. - PT/OT eval and treat- pending   Hypertension: BP 132/70 mmHg.  Goal SBP <140. - Continue amlodipine 5 mg.  - Continue labetalol 5 mg IV q 2 hours PRM SBP > 140   #Hypothyroidism: - Continue  levothyroxine    #Prediabetes: CBG this morning 103 - CBG  Diet: Regular IVF: None VTE: SCDs Dispo: Anticipated discharge in approximately 1-2 day(s).   Meredith Mody, MD 04/13/2021, 7:59 AM Pager: [phone] After 5pm on weekdays and 1pm on weekends: Please call On Call pager [phone]

## 2021-04-13 NOTE — Progress Notes (Signed)
Patient awake and alert and conversant today.  Describes some dizziness and some headache.  No nausea and vomiting though she is concerned about that.  CT scan is stable.  Nonfocal neuro exam.  We will sign off at this time.  She is an old patient of Dr. Venetia Maxon and would like to follow-up with him.  We would like for her to follow-up with him 2 weeks after discharge, please call with any questions or concerns.

## 2021-04-13 NOTE — Evaluation (Signed)
Occupational Therapy Evaluation Patient Details Name: Alexandria Potts MRN: [id number] DOB: Jun 10, 1941 Today's Date: 04/13/2021    History of Present Illness Pt is a 80 y.o.F who presents after a fall and admitted for nondisplaced occipital skull fracture complicated by intraparenchymal hemorrhage, SDH, and SAH. Significant PMH: RUE weakness of unknown etiology, hypothyroidism.   Clinical Impression   Pt PTA: Pt living with son and reports independence. Pt currently, limited by nausea, dizziness and poor activity tolerance today. Pt appears to be close to her baseline function and has assist at home. Pt currently, supervisionA to minA for ADL. MinguardA to minA for mobility with hand held assist. Pt with RUE weakness as previous deficit. Pt would benefit from continued OT skilled services. OT following acutely.    Follow Up Recommendations  Home health OT;Supervision - Intermittent    Equipment Recommendations  3 in 1 bedside commode    Recommendations for Other Services       Precautions / Restrictions Precautions Precautions: Fall;Other (comment) Precaution Comments: SBP < 140, watch O2, RUE weakness at baseline Restrictions Weight Bearing Restrictions: No      Mobility Bed Mobility Overal bed mobility: Needs Assistance Bed Mobility: Supine to Sit     Supine to sit: Min assist     General bed mobility comments: up with PT upon arrival and ended up in recliner    Transfers Overall transfer level: Needs assistance Equipment used: None Transfers: Sit to/from Stand Sit to Stand: Min assist         General transfer comment: MinA to rise and steady using RW for stability    Balance Overall balance assessment: Needs assistance Sitting-balance support: Feet supported Sitting balance-Leahy Scale: Good     Standing balance support: No upper extremity supported;During functional activity Standing balance-Leahy Scale: Fair                              ADL either performed or assessed with clinical judgement   ADL Overall ADL's : Needs assistance/impaired Eating/Feeding: Set up;Sitting   Grooming: Min guard;Standing   Upper Body Bathing: Set up;Sitting   Lower Body Bathing: Minimal assistance;Sitting/lateral leans;Sit to/from stand;Cueing for safety   Upper Body Dressing : Set up;Sitting   Lower Body Dressing: Minimal assistance;Cueing for safety;Sitting/lateral leans;Sit to/from stand   Toilet Transfer: Min guard;Ambulation;BSC   Toileting- Clothing Manipulation and Hygiene: Minimal assistance;Cueing for safety;Sitting/lateral lean;Sit to/from stand       Functional mobility during ADLs: Min guard;Cueing for safety General ADL Comments: Pt limited by nausea, dizziness and poor activity tolerance today. Pt appears to be close to her baseline function and has assist at home. Pt currently,supervisionA to minA for ADL.     Vision Baseline Vision/History: No visual deficits Patient Visual Report: No change from baseline Vision Assessment?: No apparent visual deficits     Perception     Praxis      Pertinent Vitals/Pain Pain Assessment: Faces Faces Pain Scale: Hurts even more Pain Location: headache Pain Descriptors / Indicators: Headache;Constant Pain Intervention(s): Limited activity within patient's tolerance;Monitored during session;Repositioned     Hand Dominance Right   Extremity/Trunk Assessment Upper Extremity Assessment Upper Extremity Assessment: RUE deficits/detail;LUE deficits/detail RUE Deficits / Details: Limited ROM and 2/5 strength for RUE (seeing neurology x2 years) RUE Coordination: decreased fine motor;decreased gross motor LUE Deficits / Details: AROM, WFLs and 4/5  strenght   Lower Extremity Assessment Lower Extremity Assessment: Overall WFL for tasks assessed   Cervical /  Trunk Assessment Cervical / Trunk Assessment: Normal   Communication Communication Communication: No difficulties    Cognition Arousal/Alertness: Awake/alert Behavior During Therapy: WFL for tasks assessed/performed Overall Cognitive Status: Within Functional Limits for tasks assessed                                 General Comments: Pt family reports they have noticed no cognitive change; she is still very sharp, asking them to cancel several appointments she had on the books. Pt internally distracted by pain/nausea   General Comments       Exercises     Shoulder Instructions      Home Living Family/patient expects to be discharged to:: Private residence Living Arrangements: Spouse/significant other Available Help at Discharge: Family Type of Home: House Home Access: Stairs to enter Secretary/administrator of Steps: 1   Home Layout: One level     Bathroom Shower/Tub: Walk-in shower (door opens in)         Home Equipment: None   Additional Comments: Daughter checking inventory of what equipment they have from [facility]      Prior Functioning/Environment Level of Independence: Independent                 OT Problem List: Decreased strength;Decreased activity tolerance;Impaired balance (sitting and/or standing);Decreased safety awareness;Pain      OT Treatment/Interventions: Self-care/ADL training;Therapeutic exercise;Energy conservation;DME and/or AE instruction;Therapeutic activities;Patient/family education;Balance training    OT Goals(Current goals can be found in the care plan section) Acute Rehab OT Goals Patient Stated Goal: return to independence OT Goal Formulation: With patient Time For Goal Achievement: 04/27/21 Potential to Achieve Goals: Good ADL Goals Pt Will Perform Grooming: with supervision;standing Pt Will Perform Lower Body Dressing: with supervision;sit to/from stand Additional ADL Goal #1: Pt will increase to supervisionA for standing x6 mins for OOB ADL with good safety awareness.  OT Frequency: Min 2X/week   Barriers to D/C:             Co-evaluation              AM-PAC OT "6 Clicks" Daily Activity     Outcome Measure Help from another person eating meals?: None Help from another person taking care of personal grooming?: A Little Help from another person toileting, which includes using toliet, bedpan, or urinal?: A Little Help from another person bathing (including washing, rinsing, drying)?: A Little Help from another person to put on and taking off regular upper body clothing?: A Little Help from another person to put on and taking off regular lower body clothing?: A Little 6 Click Score: 19   End of Session Equipment Utilized During Treatment: Gait belt Nurse Communication: Mobility status;Other (comment);Patient requests pain meds (request for nausea meds)  Activity Tolerance: Patient limited by pain Patient left: in chair;with call bell/phone within reach;with chair alarm set  OT Visit Diagnosis: Unsteadiness on feet (R26.81);Muscle weakness (generalized) (M62.81)                Time: 1610-9604 OT Time Calculation (min): 15 min Charges:  OT General Charges $OT Visit: 1 Visit OT Evaluation $OT Eval Moderate Complexity: 1 Mod  Flora Lipps, OTR/L Acute Rehabilitation Services Pager: [phone] Office: [phone]   Georgi Tuel C 04/13/2021, 3:06 PM

## 2021-04-13 NOTE — Evaluation (Signed)
Physical Therapy Evaluation Patient Details Name: Alexandria Potts MRN: [id number] DOB: 1940-12-25 Today's Date: 04/13/2021   History of Present Illness  Pt is a 80 y.o.F who presents after a fall and admitted for nondisplaced occipital skull fracture complicated by intraparenchymal hemorrhage, SDH, and SAH. Significant PMH: RUE weakness of unknown etiology, hypothyroidism.  Clinical Impression  PTA, pt lives with her spouse and is independent. Pt reporting nausea and constant HA; RN provided Zofran prior to session. Pt ambulating x 20 feet with handheld assist. Demonstrates dynamic instability and decreased coordination. Recommended use of walker for increased balance and support initially. Pt with desaturation to 85% on RA; placed back on 2L O2 where she rebounded to 93%, BP 147/60 (RN notified). Suspect good progress given PLOF, family support and motivation. Will continue to progress mobility as tolerated.     Follow Up Recommendations Home health PT;Supervision for mobility/OOB    Equipment Recommendations  Rolling walker with 5" wheels    Recommendations for Other Services       Precautions / Restrictions Precautions Precautions: Fall;Other (comment) Precaution Comments: watch O2, RUE weakness at baseline, SBP < 140 Restrictions Weight Bearing Restrictions: No      Mobility  Bed Mobility Overal bed mobility: Needs Assistance Bed Mobility: Supine to Sit     Supine to sit: Min assist     General bed mobility comments: MinA to guide trunk up into sitting position    Transfers Overall transfer level: Needs assistance Equipment used: None Transfers: Sit to/from Stand Sit to Stand: Min assist         General transfer comment: MinA to rise and steady  Ambulation/Gait Ambulation/Gait assistance: Min assist Gait Distance (Feet): 20 Feet Assistive device: None Gait Pattern/deviations: Step-through pattern;Decreased stride length;Drifts right/left Gait velocity:  decreased   General Gait Details: Assist for balance, pt reaching for external support  Stairs            Wheelchair Mobility    Modified Rankin (Stroke Patients Only) Modified Rankin (Stroke Patients Only) Pre-Morbid Rankin Score: No symptoms Modified Rankin: Moderately severe disability     Balance Overall balance assessment: Needs assistance Sitting-balance support: Feet supported Sitting balance-Leahy Scale: Good     Standing balance support: No upper extremity supported;During functional activity Standing balance-Leahy Scale: Fair                               Pertinent Vitals/Pain Pain Assessment: Faces Faces Pain Scale: Hurts even more Pain Location: headache Pain Descriptors / Indicators: Headache;Constant Pain Intervention(s): Limited activity within patient's tolerance;Monitored during session;Premedicated before session    Home Living Family/patient expects to be discharged to:: Private residence Living Arrangements: Spouse/significant other Available Help at Discharge: Family Type of Home: House Home Access: Stairs to enter   Secretary/administrator of Steps: 1 Home Layout: One level Home Equipment: None Additional Comments: Daughter checking inventory of what equipment they have from [facility]    Prior Function Level of Independence: Independent               Hand Dominance        Extremity/Trunk Assessment   Upper Extremity Assessment Upper Extremity Assessment: Defer to OT evaluation    Lower Extremity Assessment Lower Extremity Assessment: Overall WFL for tasks assessed    Cervical / Trunk Assessment Cervical / Trunk Assessment: Normal  Communication   Communication: No difficulties  Cognition Arousal/Alertness: Awake/alert Behavior During Therapy: WFL for tasks assessed/performed Overall Cognitive  Status: Within Functional Limits for tasks assessed                                 General Comments: Pt  family reports they have noticed no cognitive change; she is still very sharp, asking them to cancel several appointments she had on the books. Pt internally distracted by pain/nausea      General Comments      Exercises     Assessment/Plan    PT Assessment Patient needs continued PT services  PT Problem List Decreased strength;Decreased activity tolerance;Decreased balance;Decreased mobility;Pain       PT Treatment Interventions DME instruction;Gait training;Stair training;Functional mobility training;Therapeutic activities;Therapeutic exercise;Balance training;Patient/family education    PT Goals (Current goals can be found in the Care Plan section)  Acute Rehab PT Goals Patient Stated Goal: return to independence PT Goal Formulation: With patient Time For Goal Achievement: 04/27/21 Potential to Achieve Goals: Good    Frequency Min 4X/week   Barriers to discharge        Co-evaluation               AM-PAC PT "6 Clicks" Mobility  Outcome Measure Help needed turning from your back to your side while in a flat bed without using bedrails?: None Help needed moving from lying on your back to sitting on the side of a flat bed without using bedrails?: A Little Help needed moving to and from a bed to a chair (including a wheelchair)?: A Little Help needed standing up from a chair using your arms (e.g., wheelchair or bedside chair)?: A Little Help needed to walk in hospital room?: A Little Help needed climbing 3-5 steps with a railing? : A Little 6 Click Score: 19    End of Session   Activity Tolerance: Other (comment) (nausea) Patient left: in chair;with call bell/phone within reach;with chair alarm set Nurse Communication: Mobility status PT Visit Diagnosis: Unsteadiness on feet (R26.81);Pain Pain - part of body:  (head)    Time: 1478-2956 PT Time Calculation (min) (ACUTE ONLY): 28 min   Charges:   PT Evaluation $PT Eval Moderate Complexity: 1 Mod PT  Treatments $Therapeutic Activity: 8-22 mins        Lillia Pauls, PT, DPT Acute Rehabilitation Services Pager [phone] Office [phone]   Norval Morton 04/13/2021, 1:00 PM

## 2021-04-14 LAB — CBC
HCT: 35.9 % — ABNORMAL LOW (ref 36.0–46.0)
Hemoglobin: 11.6 g/dL — ABNORMAL LOW (ref 12.0–15.0)
MCH: 29.6 pg (ref 26.0–34.0)
MCHC: 32.3 g/dL (ref 30.0–36.0)
MCV: 91.6 fL (ref 80.0–100.0)
Platelets: 178 10*3/uL (ref 150–400)
RBC: 3.92 MIL/uL (ref 3.87–5.11)
RDW: 12.6 % (ref 11.5–15.5)
WBC: 5.7 10*3/uL (ref 4.0–10.5)
nRBC: 0 % (ref 0.0–0.2)

## 2021-04-14 LAB — GLUCOSE, CAPILLARY
Glucose-Capillary: 90 mg/dL (ref 70–99)
Glucose-Capillary: 107 mg/dL — ABNORMAL HIGH (ref 70–99)
Glucose-Capillary: 103 mg/dL — ABNORMAL HIGH (ref 70–99)
Glucose-Capillary: 85 mg/dL (ref 70–99)

## 2021-04-14 LAB — T3, FREE: T3, Free: 1.3 pg/mL — ABNORMAL LOW (ref 2.0–4.4)

## 2021-04-14 MED ORDER — SENNOSIDES-DOCUSATE SODIUM 8.6-50 MG PO TABS
1.0000 | ORAL_TABLET | Freq: Two times a day (BID) | ORAL | Status: DC
  Administered 2021-04-14 – 2021-04-15 (×3): 1 via ORAL
  Filled 2021-04-14 (×3): qty 1

## 2021-04-14 NOTE — TOC Initial Note (Addendum)
Transition of Care (TOC) - Initial/Assessment Note  Donn Pierini RN,BSN Transitions of Care Unit 4NP (non trauma) - RN Case Manager See Treatment Team for direct Phone #    Patient Details  Name: Alexandria Potts MRN: [id number] Date of Birth: 06-Jul-1941  Transition of Care [facility]) CM/SW Contact:    Darrold Span, RN Phone Number: 04/14/2021, 4:32 PM  Clinical Narrative:                 Pt admitted from home w/ spouse s/p fall. Orders have been placed for [facility] and DME needs.  CM spoke with pt at bedside for transition needs- pt agreeable to [facility] services and confirmed DME needs.  List provided for [facility] choice Per CMS guidelines from medicare.gov website with star ratings (copy placed in shadow chart) , per pt she would like to use [facility] for [facility] needs.   Address, phone # and [facility] [place]) confirmed with pt.   Pt agreeable to using in house provider for DME needs- Call made to [facility] for DME- RW and 3n1- DME to be delivered to room prior to discharge.   Call made to [facility]- spoke with Vikki Ports regarding [facility] referral- per Vikki Ports they do not service pt's address or [place] - can not accept.  Spoke with pt to inform and get alternate choice- pt has selected [facility] as second choice.   Call made to [facility]- spoke with Jasmine December- confirmed that they do service [place]- per Jasmine December they have PT available but currently do not have OT staffing- can accept for PT only and will add OT if staffing becomes available and pt still needs. Referral faxed to Jasmine December to review [phone]).   6/24-1200- spoke with Jasmine December at [facility]- pt has been accepted for HHPT- they will plan to see pt on Monday 6/27 for [facility]. Will need to fax d/c summary on discharge.     Expected Discharge Plan: Home w Home Health Services Barriers to Discharge: No Barriers Identified   Patient Goals and CMS Choice Patient states their goals for this  hospitalization and ongoing recovery are:: return home CMS Medicare.gov Compare Post Acute Care list provided to:: Patient Choice offered to / list presented to : Patient  Expected Discharge Plan and Services Expected Discharge Plan: Home w Home Health Services   Discharge Planning Services: CM Consult Post Acute Care Choice: Home Health, Durable Medical Equipment Living arrangements for the past 2 months: Single Family Home                 DME Arranged: 3-N-1, Walker rolling DME Agency: [facility] Date DME Agency Contacted: 04/14/21 Time DME Agency Contacted: [phone] Representative spoke with at DME Agency: Velna Hatchet HH Arranged: PT, OT HH Agency: [facility] Date [facility] Agency Contacted: 04/14/21 Time HH Agency Contacted: [phone] Representative spoke with at [facility] Agency: Jasmine December  Prior Living Arrangements/Services Living arrangements for the past 2 months: Single Family Home Lives with:: Self, Spouse Patient language and need for interpreter reviewed:: Yes Do you feel safe going back to the place where you live?: Yes      Need for Family Participation in Patient Care: Yes (Comment) Care giver support system in place?: Yes (comment)   Criminal Activity/Legal Involvement Pertinent to Current Situation/Hospitalization: No - Comment as needed  Activities of Daily Living      Permission Sought/Granted Permission sought to share information with : Facility Industrial/product designer granted to share information with : Yes, Verbal  Permission Granted     Permission granted to share info w AGENCY: HH and DME        Emotional Assessment Appearance:: Appears stated age Attitude/Demeanor/Rapport: Engaged Affect (typically observed): Appropriate, Accepting, Pleasant Orientation: : Oriented to Self, Oriented to Place, Oriented to  Time, Oriented to Situation Alcohol / Substance Use: Not Applicable Psych Involvement: No (comment)  Admission diagnosis:  Subdural hematoma  (HCC) [S06.5X9A] SAH (subarachnoid hemorrhage) (HCC) [I60.9] Normochromic normocytic anemia [D64.9] Closed fracture of right side of occipital bone, initial encounter (HCC) [S02.119A] Patient Active Problem List   Diagnosis Date Noted   Subdural hematoma (HCC) 04/12/2021   Fracture of occipital bone of skull with loss of consciousness (HCC) 04/12/2021   Subarachnoid hemorrhage (HCC) 04/12/2021   Intraparenchymal hemorrhage of brain (HCC) 04/12/2021   Hypothyroidism 04/12/2021   Right arm weakness 03/08/2021   PCP:  Pcp, No Pharmacy:   [facility] - [place], [place] - [address] [place] [address] Phone: [phone] Fax: [phone]     Social Determinants of Health (SDOH) Interventions    Readmission Risk Interventions No flowsheet data found.

## 2021-04-14 NOTE — Progress Notes (Signed)
   Subjective:  Patient seen in her room this morning.  States she still has headache and nausea.  Still feels weak and not ready for discharge today.  States she needs1 more day.  Patient complained of constipation. Objective:  Vital signs in last 24 hours: Vitals:   04/13/21 1544 04/13/21 1915 04/13/21 2325 04/14/21 0330  BP: (!) 113/57 133/60 (!) 123/58 135/63  Pulse: 61 62 (!) 59 (!) 57  Resp: 14 (!) 22 (!) 22   Temp: 98.3 F (36.8 C) 98.4 F (36.9 C) 98.5 F (36.9 C) 97.9 F (36.6 C)  TempSrc: Oral Oral Oral Oral  SpO2: 96% 98% 98% 96%  Weight:      Height:        Physical Exam Constitutional: Pleasant elderly lady lying in bed.  Interacting well.  Not in acute distress.  HEENT: No icterus. Cardiovascular: Regular rate and rhythm, normal heart sounds, no murmurs/rubs/gallops Pulmonary: Effort normal, breathing comfortably on room air.  Musculoskeletal: No peripheral edema.   Skin: Warm and dry.  Neurological: Alert and oriented, no dysarthria, answering question appropriately.  Chronic right arm weakness present.  No new focal deficits.   Psychiatric: Normal mood and affect. Assessment/Plan:  Principal Problem:   Fracture of occipital bone of skull with loss of consciousness (HCC) Active Problems:   Subdural hematoma (HCC)   Subarachnoid hemorrhage (HCC)   Intraparenchymal hemorrhage of brain (HCC)   Hypothyroidism  Alexandria Potts is a 80 y.o. with pertinent PMH of prediabetes, hypothyroidism who presented after a fall  and admit for nondisplaced occipital skull fracture complicated by intraparenchymal hemorrhage, subdural hematoma, and subarachnoid hemorrhage.  #Fall with nondisplaced occipital skull fracture, Intraparenchymal hemorrhage,Subdural hematoma, Subarachnoid hemorrhage. Pt still has headache and nausea.   neurosurgery signed off. Neurosurgery recommended to follow-up with Dr. Venetia Maxon in 2 weeks.  PT/OT recommended home health OT and PT. - Neurosurgery  signed off.  Recommended to follow-up with Dr. Meredith Mody in 2 weeks. - Continue oxycodone for pain and Zofran for nausea. -Senna for constipation  Hypertension: BP 139/72 mmHg.  Goal SBP <140. - Continue amlodipine 5 mg.  - Continue labetalol 5 mg IV q 2 hours PRM SBP > 140  #Hypothyroidism: - Continue  levothyroxine   #Prediabetes: CBG 96  PT/OT - Home health OT and PT Dispo: Anticipated discharge in approximately 1-2 day(s).   Karsten Ro, MD 04/14/2021, 6:54 AM Pager: [phone] After 5pm on weekdays and 1pm on weekends: Please call On Call pager [phone]

## 2021-04-14 NOTE — Progress Notes (Signed)
Physical Therapy Treatment Patient Details Name: Alexandria Potts MRN: [id number] DOB: [birth year] Today's Date: 04/14/2021    History of Present Illness Pt is a 80 y.o.F who presents after a fall and admitted for nondisplaced occipital skull fracture complicated by intraparenchymal hemorrhage, SDH, and SAH. Significant PMH: RUE weakness of unknown etiology, hypothyroidism.    PT Comments    The pt was able to make good progress with OOB mobility this session. She was able to complete multiple bouts of ambulation in the room with single UE support, but continues to be limited by nausea, dizziness, and fatigue. The pt was able to maintain SpO2 > 90% with guided breathing during activity, but after last bout of walking (total of 60 ft), SpO2 sustaining 87% and pt returned to 1L O2. The pt wand her sister were educated in home walking program, fall risk reduction at home, and self-monitoring of exercise. The pt will continue to benefit from skilled PT acutely and following d/c to facilitate return to prior level of function and independence.     Follow Up Recommendations  Home health PT;Supervision for mobility/OOB     Equipment Recommendations  Rolling walker with 5" wheels    Recommendations for Other Services       Precautions / Restrictions Precautions Precautions: Fall;Other (comment) Precaution Comments: SBP < 140, watch O2, RUE weakness at baseline Restrictions Weight Bearing Restrictions: No    Mobility  Bed Mobility Overal bed mobility: Needs Assistance Bed Mobility: Supine to Sit     Supine to sit: Min assist     General bed mobility comments: pt reaching for minA, could likely complete with increased time and ues of bed rails. minA toelevate trunk    Transfers Overall transfer level: Needs assistance Equipment used: None Transfers: Sit to/from Stand Sit to Stand: Min assist         General transfer comment: minA to minG to power up from EOB and recliner.  x8through session  Ambulation/Gait Ambulation/Gait assistance: Min assist Gait Distance (Feet): 10 Feet (+ 20 ft + 20 ft + 10 ft) Assistive device: 1 person hand held assist Gait Pattern/deviations: Step-through pattern;Decreased stride length;Drifts right/left;Wide base of support Gait velocity: decreased   General Gait Details: Assist for balance, pt reaching for external support    Modified Rankin (Stroke Patients Only) Modified Rankin (Stroke Patients Only) Pre-Morbid Rankin Score: No symptoms Modified Rankin: Moderately severe disability     Balance Overall balance assessment: Needs assistance Sitting-balance support: Feet supported Sitting balance-Leahy Scale: Good     Standing balance support: No upper extremity supported;During functional activity Standing balance-Leahy Scale: Fair Standing balance comment: initially with single UE support, pt with minA to steady. progressed to short bout of ambulaiton without UE support by end of session, no overt LOB                            Cognition Arousal/Alertness: Awake/alert Behavior During Therapy: WFL for tasks assessed/performed Overall Cognitive Status: Within Functional Limits for tasks assessed                                 General Comments: pt internally distracted by pain/nausea but able to follow all cues/commands through session      Exercises Other Exercises Other Exercises: 5x sit-stand from recliner without use of UE. cues for slow eccentric lower    General Comments General comments (skin integrity,  edema, etc.): SpO2 to 87% on RA following activity with good pleth. Pt mostly able to maintain SpO2 in 90s with activity, but needed addiitonal 1L O2 after session to maintain >90%.      Pertinent Vitals/Pain Pain Assessment: No/denies pain Pain Intervention(s): Limited activity within patient's tolerance;Monitored during session     PT Goals (current goals can now be found in  the care plan section) Acute Rehab PT Goals Patient Stated Goal: return to independence PT Goal Formulation: With patient Time For Goal Achievement: 04/27/21 Potential to Achieve Goals: Good Progress towards PT goals: Progressing toward goals    Frequency    Min 4X/week      PT Plan Current plan remains appropriate       AM-PAC PT "6 Clicks" Mobility   Outcome Measure  Help needed turning from your back to your side while in a flat bed without using bedrails?: None Help needed moving from lying on your back to sitting on the side of a flat bed without using bedrails?: A Little Help needed moving to and from a bed to a chair (including a wheelchair)?: A Little Help needed standing up from a chair using your arms (e.g., wheelchair or bedside chair)?: A Little Help needed to walk in hospital room?: A Little Help needed climbing 3-5 steps with a railing? : A Little 6 Click Score: 19    End of Session Equipment Utilized During Treatment: Gait belt;Oxygen Activity Tolerance: Patient tolerated treatment well (nausea and dizziness) Patient left: in chair;with call bell/phone within reach;with family/visitor present;with chair alarm set Nurse Communication: Mobility status PT Visit Diagnosis: Unsteadiness on feet (R26.81);Pain     Time: 2683-4196 PT Time Calculation (min) (ACUTE ONLY): 30 min  Charges:  $Gait Training: 8-22 mins $Therapeutic Activity: 8-22 mins                     Alexandria Potts, PT, DPT   Acute Rehabilitation Department Pager #: [phone]   Gaetana Michaelis 04/14/2021, 3:33 PM

## 2021-04-15 LAB — GLUCOSE, CAPILLARY
Glucose-Capillary: 99 mg/dL (ref 70–99)
Glucose-Capillary: 92 mg/dL (ref 70–99)
Glucose-Capillary: 98 mg/dL (ref 70–99)

## 2021-04-15 MED ORDER — KETOROLAC TROMETHAMINE 15 MG/ML IJ SOLN
15.0000 mg | Freq: Once | INTRAMUSCULAR | Status: AC
  Administered 2021-04-15: 15 mg via INTRAVENOUS
  Filled 2021-04-15: qty 1

## 2021-04-15 MED ORDER — KETOROLAC TROMETHAMINE 15 MG/ML IJ SOLN
15.0000 mg | Freq: Four times a day (QID) | INTRAMUSCULAR | Status: DC | PRN
  Administered 2021-04-15 – 2021-04-16 (×3): 15 mg via INTRAVENOUS
  Filled 2021-04-15 (×3): qty 1

## 2021-04-15 MED ORDER — GABAPENTIN 300 MG PO CAPS
300.0000 mg | ORAL_CAPSULE | Freq: Every day | ORAL | Status: DC
  Administered 2021-04-15: 300 mg via ORAL
  Filled 2021-04-15: qty 1

## 2021-04-15 NOTE — Progress Notes (Signed)
Physical Therapy Treatment Patient Details Name: Alexandria Potts MRN: [id number] DOB: 1941/04/24 Today's Date: 04/15/2021    History of Present Illness Pt is a 80 y.o.F who presents after a fall and admitted for nondisplaced occipital skull fracture complicated by intraparenchymal hemorrhage, SDH, and SAH. Significant PMH: RUE weakness of unknown etiology, hypothyroidism.    PT Comments    The pt was seen for progression of mobility this morning, but presents with increased nausea and decreased motivation for activity. The pt had x2 episodes of vomiting during session, reports nausea increased with all mobility, and was provided medication for nausea by RN during session for pt comfort. The pt continues to require physical assist for all bed mobility, sit-stand transfers, and with gait due to deficits in strength, stability, and endurance that persist at this time. The pt also continues to require O2 for mobility, with SpO2 dropping to 87% with good pleth with ambulation on RA. The pt will continue to benefit from skilled PT to improve activity tolerance to allow for safe return home.    Follow Up Recommendations  Home health PT;Supervision for mobility/OOB     Equipment Recommendations  Rolling walker with 5" wheels (would need O2 if d/c at this time)    Recommendations for Other Services       Precautions / Restrictions Precautions Precautions: Fall;Other (comment) Precaution Comments: SBP < 140, watch O2, RUE weakness at baseline Restrictions Weight Bearing Restrictions: No    Mobility  Bed Mobility Overal bed mobility: Needs Assistance Bed Mobility: Supine to Sit     Supine to sit: Min assist     General bed mobility comments: minA to complete, pt needing max cues for encouragement, assist to complete movement to EOB, especially for trunk elevation    Transfers Overall transfer level: Needs assistance Equipment used: 1 person hand held assist Transfers: Sit to/from  Stand Sit to Stand: Min assist         General transfer comment: minA to minG to power up from EOB and recliner. x8through session  Ambulation/Gait Ambulation/Gait assistance: Min Chemical engineer (Feet): 30 Feet Assistive device: 1 person hand held assist Gait Pattern/deviations: Step-through pattern;Decreased stride length;Drifts right/left;Wide base of support Gait velocity: decreased   General Gait Details: assist for balance, pt reaching for external support, stopping x3 due to c/o nausea with one episode of vomiting. slowed gait with significant lateral movement/instability    Modified Rankin (Stroke Patients Only) Modified Rankin (Stroke Patients Only) Pre-Morbid Rankin Score: No symptoms Modified Rankin: Moderately severe disability     Balance Overall balance assessment: Needs assistance Sitting-balance support: Feet supported Sitting balance-Leahy Scale: Good     Standing balance support: No upper extremity supported;During functional activity Standing balance-Leahy Scale: Fair Standing balance comment: needing at least single UE support this session                            Cognition Arousal/Alertness: Awake/alert Behavior During Therapy: Flat affect Overall Cognitive Status: Impaired/Different from baseline Area of Impairment: Problem solving;Safety/judgement                         Safety/Judgement: Decreased awareness of deficits;Decreased awareness of safety     General Comments: pt internally distracted by nausea, with decreased insight to need for continued care and assist. able to follow cues/commands, but needing repeated cues for how she would mobilize or manage at home  General Comments General comments (skin integrity, edema, etc.): SpO2 to 87% on RA with good pleth, improved to 90s with 1L O2 for activity.      Pertinent Vitals/Pain Pain Assessment: Faces Faces Pain Scale: Hurts even more Pain Location:  nausea Pain Intervention(s): Limited activity within patient's tolerance;RN gave pain meds during session;Monitored during session     PT Goals (current goals can now be found in the care plan section) Acute Rehab PT Goals Patient Stated Goal: return to independence PT Goal Formulation: With patient Time For Goal Achievement: 04/27/21 Potential to Achieve Goals: Good Progress towards PT goals: Not progressing toward goals - comment (limited by nausea/vomiting)    Frequency    Min 4X/week      PT Plan Current plan remains appropriate       AM-PAC PT "6 Clicks" Mobility   Outcome Measure  Help needed turning from your back to your side while in a flat bed without using bedrails?: None Help needed moving from lying on your back to sitting on the side of a flat bed without using bedrails?: A Little Help needed moving to and from a bed to a chair (including a wheelchair)?: A Little Help needed standing up from a chair using your arms (e.g., wheelchair or bedside chair)?: A Little Help needed to walk in hospital room?: A Little Help needed climbing 3-5 steps with a railing? : A Little 6 Click Score: 19    End of Session Equipment Utilized During Treatment: Gait belt;Oxygen Activity Tolerance: Patient tolerated treatment well Patient left: in chair;with call bell/phone within reach;with family/visitor present;with chair alarm set Nurse Communication: Mobility status (nausea) PT Visit Diagnosis: Unsteadiness on feet (R26.81);Pain     Time: 7209-4709 PT Time Calculation (min) (ACUTE ONLY): 35 min  Charges:  $Gait Training: 8-22 mins $Therapeutic Activity: 8-22 mins                     [name], PT, DPT   Acute Rehabilitation Department Pager #: [phone]   Gaetana Michaelis 04/15/2021, 10:53 AM

## 2021-04-15 NOTE — Progress Notes (Signed)
Occupational Therapy Treatment Patient Details Name: Alexandria Potts MRN: [id number] DOB: 03/26/1941 Today's Date: 04/15/2021    History of present illness Pt is a 80 y.o.F who presents after a fall and admitted for nondisplaced occipital skull fracture complicated by intraparenchymal hemorrhage, SDH, and SAH. Significant PMH: RUE weakness of unknown etiology, hypothyroidism.   OT comments  Pt. Seen for skilled OT session.  Limited session secondary to nausea.  Able to complete seated grooming task and LB dressing.  Sit/stand in prep for increasing ambulation to b.room when able to tolerate.  Pt. Had vomited several times prior to session but did not vomit during this session.  Remains nauseas and c/o pain in R shoulder.  Will progress ADLs next session as pt. Able.    Follow Up Recommendations  Home health OT;Supervision - Intermittent    Equipment Recommendations  3 in 1 bedside commode    Recommendations for Other Services      Precautions / Restrictions Precautions Precautions: Fall;Other (comment) Precaution Comments: SBP < 140, watch O2, RUE weakness at baseline Restrictions Weight Bearing Restrictions: No       Mobility Bed Mobility Overal bed mobility: Needs Assistance Bed Mobility: Supine to Sit     Supine to sit: Min assist     General bed mobility comments: seated in recliner prior to pts. arrival    Transfers Overall transfer level: Needs assistance Equipment used: Rolling walker (2 wheeled) Transfers: Sit to/from Stand Sit to Stand: Min assist         General transfer comment: minA to minG to power up from recliner.    Balance Overall balance assessment: Needs assistance Sitting-balance support: Feet supported Sitting balance-Leahy Scale: Good     Standing balance support: No upper extremity supported;During functional activity Standing balance-Leahy Scale: Fair Standing balance comment: needing at least single UE support this session                            ADL either performed or assessed with clinical judgement   ADL Overall ADL's : Needs assistance/impaired     Grooming: Sitting;Set up;Brushing hair               Lower Body Dressing: Set up;Sitting/lateral leans Lower Body Dressing Details (indicate cue type and reason): reviewed not bending forward as it increases fall risk. pt. able to safely return demo of don/doff B socks             Functional mobility during ADLs: Min guard;Cueing for safety General ADL Comments: session limited by nausea, dizziness, and c/o R shoulder pain.  sit/stand as trial for increased ambulation for adls. able to stand and take forward and backward steps to recliner.  pt. had vomited 2-3 times prior to arrival     Vision       Perception     Praxis      Cognition Arousal/Alertness: Awake/alert Behavior During Therapy: Flat affect Overall Cognitive Status: Impaired/Different from baseline Area of Impairment: Problem solving;Safety/judgement                         Safety/Judgement: Decreased awareness of deficits;Decreased awareness of safety     General Comments: pt internally distracted by nausea, with decreased insight to need for continued care and assist. able to follow cues/commands, but needing repeated cues for how she would mobilize or manage at home        Exercises  Shoulder Instructions       General Comments SpO2 to 87% on RA with good pleth, improved to 90s with 1L O2 for activity.    Pertinent Vitals/ Pain       Pain Assessment: Faces Faces Pain Scale: Hurts little more Pain Location: nausea and R shoulder pain Pain Descriptors / Indicators: Aching Pain Intervention(s): Limited activity within patient's tolerance;Monitored during session;Repositioned  Home Living                                          Prior Functioning/Environment              Frequency  Min 2X/week        Progress  Toward Goals  OT Goals(current goals can now be found in the care plan section)  Progress towards OT goals: Progressing toward goals  Acute Rehab OT Goals Patient Stated Goal: return to independence  Plan Discharge plan remains appropriate    Co-evaluation                 AM-PAC OT "6 Clicks" Daily Activity     Outcome Measure   Help from another person eating meals?: None Help from another person taking care of personal grooming?: A Little Help from another person toileting, which includes using toliet, bedpan, or urinal?: A Little Help from another person bathing (including washing, rinsing, drying)?: A Little Help from another person to put on and taking off regular upper body clothing?: A Little Help from another person to put on and taking off regular lower body clothing?: A Little 6 Click Score: 19    End of Session    OT Visit Diagnosis: Unsteadiness on feet (R26.81);Muscle weakness (generalized) (M62.81)   Activity Tolerance Other (comment) (limited by nausea)   Patient Left in chair;with call bell/phone within reach;with family/visitor present   Nurse Communication          Time: 8502-7741 OT Time Calculation (min): 17 min  Charges: OT General Charges $OT Visit: 1 Visit OT Treatments $Self Care/Home Management : 8-22 mins  Boneta Lucks, COTA/L Acute Rehabilitation [phone]    Salvadore Oxford 04/15/2021, 1:25 PM

## 2021-04-15 NOTE — Care Management Important Message (Signed)
Important Message  Patient Details  Name: Alexandria Potts MRN: [id number] Date of Birth: July 22, 1941   Medicare Important Message Given:  Yes     Dorena Bodo 04/15/2021, 3:01 PM

## 2021-04-15 NOTE — Progress Notes (Addendum)
   Subjective:  Patient seen in her room this morning.  Patient states she is not feeling good.  Vomited twice while working with PT today.  She complains of  headache and weakness.  She states that usually Aleve helps with headache. Objective:  Vital signs in last 24 hours: Vitals:   04/14/21 1509 04/14/21 2000 04/14/21 2345 04/15/21 0400  BP: (!) 142/71 (!) 149/64 (!) 150/66 (!) 141/70  Pulse: 80 65 66 69  Resp: 16 18 17 19   Temp: 98.3 F (36.8 C) 98.7 F (37.1 C) 97.8 F (36.6 C) 98.2 F (36.8 C)  TempSrc: Oral Oral Oral Oral  SpO2: 95% 96% 98% 92%  Weight:      Height:         Physical Exam Constitutional: Pleasant elderly lady sitting on chair.  Not in acute distress.  Hard of hearing HEENT: No icterus. Cardiovascular: Regular rate and rhythm, no murmurs/rubs/gallops Pulmonary: Effort normal, breathing comfortably at room air.  Musculoskeletal: No peripheral edema.   Skin: Warm and dry  neurological: Alert and oriented, no dysarthria, answering question appropriately.  Chronic right arm weakness.  No new focal deficits.  Psychiatric: Normal mood and affect.   Assessment/Plan:  Principal Problem:   Fracture of occipital bone of skull with loss of consciousness (HCC) Active Problems:   Subdural hematoma (HCC)   Subarachnoid hemorrhage (HCC)   Intraparenchymal hemorrhage of brain (HCC)   Hypothyroidism Ester Hilley is a 80 y.o. who presented after a fall  and admitted for nondisplaced occipital skull fracture complicated by intraparenchymal hemorrhage, subdural hematoma, and subarachnoid hemorrhage.  #Fall with nondisplaced occipital skull fracture, Intraparenchymal hemorrhage,Subdural hematoma, Subarachnoid hemorrhage. Patient still complains of headache and nausea. Vomited twice with PT.  PT/OT recommended home health OT and PT. received 1 dose of Toradol which helped her.  -Neurosurgery follow-up with Dr. 96 in 2 weeks.   -Zofran for nausea. -Senna for  constipation --Stop oxycodone and start Toradol 15 mg every 6 hrs PRN for pain.  Diabetic neuropathy -Start home gabapentin 300 mg at bedtime  Hypertension: BP 141/70 mmHg.  Goal SBP <140. - Continue amlodipine 5 mg.  - Continue labetalol 5 mg IV q 2 hours PRM SBP > 140  #Hypothyroidism: - Continue  levothyroxine   PT/OT - Home health OT and PT Dispo: Anticipated discharge in approximately 1-2 day(s).   Venetia Maxon, MD 04/15/2021, 6:35 AM Pager: [phone] After 5pm on weekdays and 1pm on weekends: Please call On Call pager [phone]

## 2021-04-16 LAB — GLUCOSE, CAPILLARY
Glucose-Capillary: 104 mg/dL — ABNORMAL HIGH (ref 70–99)
Glucose-Capillary: 106 mg/dL — ABNORMAL HIGH (ref 70–99)

## 2021-04-16 LAB — CBC
HCT: 35.8 % — ABNORMAL LOW (ref 36.0–46.0)
Hemoglobin: 12.4 g/dL (ref 12.0–15.0)
MCH: 29.7 pg (ref 26.0–34.0)
MCHC: 34.6 g/dL (ref 30.0–36.0)
MCV: 85.9 fL (ref 80.0–100.0)
Platelets: 247 10*3/uL (ref 150–400)
RBC: 4.17 MIL/uL (ref 3.87–5.11)
RDW: 12.2 % (ref 11.5–15.5)
WBC: 7.3 10*3/uL (ref 4.0–10.5)
nRBC: 0 % (ref 0.0–0.2)

## 2021-04-16 MED ORDER — NAPROXEN 500 MG PO TABS: 500.0000 mg | ORAL_TABLET | Freq: Two times a day (BID) | ORAL | 0 refills | Status: AC

## 2021-04-16 MED ORDER — GABAPENTIN 300 MG PO CAPS: 300.0000 mg | ORAL_CAPSULE | Freq: Every day | ORAL | 0 refills | Status: AC

## 2021-04-16 MED ORDER — ONDANSETRON HCL 4 MG PO TABS: 4.0000 mg | ORAL_TABLET | Freq: Every day | ORAL | 1 refills | Status: AC | PRN

## 2021-04-16 MED ORDER — ACETAMINOPHEN 325 MG PO TABS: 1000.0000 mg | ORAL_TABLET | Freq: Three times a day (TID) | ORAL | Status: AC | PRN

## 2021-04-16 MED ORDER — TRAMADOL HCL 50 MG PO TABS: 50.0000 mg | ORAL_TABLET | Freq: Four times a day (QID) | ORAL | 0 refills | Status: DC | PRN

## 2021-04-16 NOTE — Progress Notes (Signed)
Physical Therapy Treatment Patient Details Name: Alexandria Potts MRN: [id number] DOB: 03/31/1941 Today's Date: 04/16/2021    History of Present Illness Pt is a 80 y.o.F who presents after a fall and admitted for nondisplaced occipital skull fracture complicated by intraparenchymal hemorrhage, SDH, and SAH. Significant PMH: RUE weakness of unknown etiology, hypothyroidism.    PT Comments    The pt was seen for attempted mobility progression and education prior to anticipated d/c. The pt remains limited by significant nausea this session, was only able to tolerate short bout of ambulation in the room prior to returning to supine position. The pt demos poor safety awareness, limited visual scanning or management of RW with gait, running into multiple objects in the room and needing assist to maintain balance and to manage RW. The pt and her daughter were educated on importance of continued mobility at home, and advised to ask HHPT to assess for vestibular involvement.     Follow Up Recommendations  Home health PT;Supervision for mobility/OOB (check for vestibular involvement)     Equipment Recommendations  Rolling walker with 5" wheels    Recommendations for Other Services       Precautions / Restrictions Precautions Precautions: Fall;Other (comment) Precaution Comments: vestibular involvement?, RUE weakness at baseline Restrictions Weight Bearing Restrictions: No    Mobility  Bed Mobility Overal bed mobility: Needs Assistance             General bed mobility comments: sitting EOB with OT upon arrival    Transfers Overall transfer level: Needs assistance Equipment used: Rolling walker (2 wheeled) Transfers: Sit to/from Stand Sit to Stand: Min assist         General transfer comment: minA to steady, pt with good ability to power up from EOB  Ambulation/Gait Ambulation/Gait assistance: Min assist Gait Distance (Feet): 20 Feet Assistive device: Rolling walker (2  wheeled) Gait Pattern/deviations: Step-through pattern;Decreased stride length;Drifts right/left;Wide base of support;Trunk flexed Gait velocity: decreased Gait velocity interpretation: <1.8 ft/sec, indicate of risk for recurrent falls General Gait Details: significant trunk flexion, poor positioning in RW with no attempt to correct with cues. running into objects to L with no attempt to correct, decreased visual scanning   Stairs             Wheelchair Mobility    Modified Rankin (Stroke Patients Only) Modified Rankin (Stroke Patients Only) Pre-Morbid Rankin Score: No symptoms Modified Rankin: Moderately severe disability     Balance Overall balance assessment: Needs assistance Sitting-balance support: Feet supported Sitting balance-Leahy Scale: Good     Standing balance support: Bilateral upper extremity supported;During functional activity Standing balance-Leahy Scale: Fair Standing balance comment: needing at least single UE support this session                            Cognition Arousal/Alertness: Awake/alert Behavior During Therapy: Flat affect Overall Cognitive Status: Impaired/Different from baseline Area of Impairment: Attention;Following commands;Safety/judgement;Problem solving                   Current Attention Level: Focused   Following Commands: Follows one step commands inconsistently;Follows one step commands with increased time Safety/Judgement: Decreased awareness of deficits;Decreased awareness of safety   Problem Solving: Slow processing;Decreased initiation;Difficulty sequencing;Requires verbal cues General Comments: pt internally distracted by nausea, decreased awareness of environment, safety, and limited applicaiton of cues to mobility. poor safety awareness with mobility, running into objects with no attempt to correct  Exercises      General Comments General comments (skin integrity, edema, etc.): pt nauseated  through session, no change in sx. no episodes of vomiting at this time. SpO2 88-92% on RA      Pertinent Vitals/Pain Pain Assessment: Faces Faces Pain Scale: Hurts little more Pain Location: nausea and R shoulder pain Pain Descriptors / Indicators: Grimacing Pain Intervention(s): Limited activity within patient's tolerance;Premedicated before session;Monitored during session;Repositioned     PT Goals (current goals can now be found in the care plan section) Acute Rehab PT Goals Patient Stated Goal: return to independence PT Goal Formulation: With patient Time For Goal Achievement: 04/27/21 Potential to Achieve Goals: Good Progress towards PT goals: Progressing toward goals    Frequency    Min 4X/week      PT Plan Current plan remains appropriate    Co-evaluation PT/OT/SLP Co-Evaluation/Treatment: Yes Reason for Co-Treatment: To address functional/ADL transfers (due to nausea/dizziness, impending d/c) PT goals addressed during session: Mobility/safety with mobility;Balance;Proper use of DME        AM-PAC PT "6 Clicks" Mobility   Outcome Measure  Help needed turning from your back to your side while in a flat bed without using bedrails?: None Help needed moving from lying on your back to sitting on the side of a flat bed without using bedrails?: A Little Help needed moving to and from a bed to a chair (including a wheelchair)?: A Little Help needed standing up from a chair using your arms (e.g., wheelchair or bedside chair)?: A Little Help needed to walk in hospital room?: A Little Help needed climbing 3-5 steps with a railing? : A Little 6 Click Score: 19    End of Session Equipment Utilized During Treatment: Gait belt Activity Tolerance: Other (comment) (limited by nausea) Patient left: in bed;with call bell/phone within reach;with family/visitor present Nurse Communication: Mobility status PT Visit Diagnosis: Unsteadiness on feet (R26.81);Pain     Time:  1610-9604 PT Time Calculation (min) (ACUTE ONLY): 20 min  Charges:  $Gait Training: 8-22 mins                     Alexandria Potts, PT, DPT   Acute Rehabilitation Department Pager #: [phone]   Alexandria Potts 04/16/2021, 1:23 PM

## 2021-04-16 NOTE — Progress Notes (Signed)
Occupational Therapy Treatment Patient Details Name: Alexandria Potts MRN: [id number] DOB: 03-24-1941 Today's Date: 04/16/2021    History of present illness Pt is a 80 y.o.F who presents after a fall and admitted for nondisplaced occipital skull fracture complicated by intraparenchymal hemorrhage, SDH, and SAH. Significant PMH: RUE weakness of unknown etiology, hypothyroidism.   OT comments  Pt planning to discharge home today.  She continues with nausea and headache with movement/activity, however, no vomiting this date.  She did demonstrate 2-3 beats horizontal nystagmus with Lt and Rt gaze.  She demonstrates decreased scanning of her environment when ambulating.  She requires min - mod A for ADLs.  Daughter reports family will be able to provide 24 hour assist and direct assist when she is up moving.  Recommend HHOT.   Follow Up Recommendations  Home health OT;Supervision - Intermittent    Equipment Recommendations  3 in 1 bedside commode    Recommendations for Other Services      Precautions / Restrictions Precautions Precautions: Fall;Other (comment) Precaution Comments: vestibular involvement?, RUE weakness at baseline Restrictions Weight Bearing Restrictions: No       Mobility Bed Mobility Overal bed mobility: Needs Assistance Bed Mobility: Supine to Sit;Sit to Supine     Supine to sit: Min assist Sit to supine: Min guard   General bed mobility comments: pt requires assist to lift trunk as she moves slowly and is guarded    Transfers Overall transfer level: Needs assistance Equipment used: Rolling walker (2 wheeled) Transfers: Sit to/from [employer] Sit to Stand: Min assist Stand pivot transfers: Min assist       General transfer comment: minA to steady, pt with good ability to power up from EOB.  assist to guide the RW as she does not adequately scan her environment for obstacles    Balance Overall balance assessment: Needs  assistance Sitting-balance support: Feet supported Sitting balance-Leahy Scale: Good     Standing balance support: Bilateral upper extremity supported;During functional activity Standing balance-Leahy Scale: Fair Standing balance comment: needing at least single UE support this session                           ADL either performed or assessed with clinical judgement   ADL Overall ADL's : Needs assistance/impaired                 Upper Body Dressing : Minimal assistance;Sitting   Lower Body Dressing: Moderate assistance;Sit to/from stand   Toilet Transfer: Ambulation;BSC;Minimal assistance           Functional mobility during ADLs: Cueing for safety;Minimal assistance General ADL Comments: Pt limited by nausea,  and HA     Vision   Additional Comments: Pt able to tolerate overhead lights and blinds lifted.  She demonstrates 2-3 beats horizontal nystagmus with pursuits with both Lt and Rt gaze   Perception     Praxis      Cognition Arousal/Alertness: Awake/alert Behavior During Therapy: Flat affect Overall Cognitive Status: Impaired/Different from baseline Area of Impairment: Attention;Following commands;Safety/judgement;Problem solving                   Current Attention Level: Focused   Following Commands: Follows one step commands inconsistently;Follows one step commands with increased time Safety/Judgement: Decreased awareness of deficits;Decreased awareness of safety   Problem Solving: Slow processing;Decreased initiation;Difficulty sequencing;Requires verbal cues General Comments: pt internally distracted by nausea, decreased awareness of environment, safety, and limited applicaiton of  cues to mobility. poor safety awareness with mobility, running into objects with no attempt to correct        Exercises     Shoulder Instructions       General Comments pt nauseated through session, no change in sx. no episodes of vomiting at this  time. SpO2 88-92% on RA. Daughter reports someone will be with pt 24/7 and will provide direct assist with ambulation/transfers    Pertinent Vitals/ Pain       Pain Assessment: Faces Faces Pain Scale: Hurts little more Pain Location: nausea, R shoulder pain, and posterior HA Pain Descriptors / Indicators: Grimacing;Guarding Pain Intervention(s): Monitored during session;Limited activity within patient's tolerance;Repositioned  Home Living                                          Prior Functioning/Environment              Frequency  Min 2X/week        Progress Toward Goals  OT Goals(current goals can now be found in the care plan section)  Progress towards OT goals: Progressing toward goals  Acute Rehab OT Goals Patient Stated Goal: return to independence  Plan Discharge plan remains appropriate    Co-evaluation    PT/OT/SLP Co-Evaluation/Treatment: Yes Reason for Co-Treatment: To address functional/ADL transfers (due to n/v and impending d/c) PT goals addressed during session: Mobility/safety with mobility;Balance;Proper use of DME OT goals addressed during session: ADL's and self-care      AM-PAC OT "6 Clicks" Daily Activity     Outcome Measure   Help from another person eating meals?: None Help from another person taking care of personal grooming?: A Little Help from another person toileting, which includes using toliet, bedpan, or urinal?: A Little Help from another person bathing (including washing, rinsing, drying)?: A Little Help from another person to put on and taking off regular upper body clothing?: A Little Help from another person to put on and taking off regular lower body clothing?: A Lot 6 Click Score: 18    End of Session    OT Visit Diagnosis: Unsteadiness on feet (R26.81);Muscle weakness (generalized) (M62.81)   Activity Tolerance Other (comment) (Nausea)   Patient Left in bed;with call bell/phone within reach;with  family/visitor present   Nurse Communication Mobility status        Time: 4081-4481 OT Time Calculation (min): 38 min  Charges: OT General Charges $OT Visit: 1 Visit OT Treatments $Self Care/Home Management : 8-22 mins $Therapeutic Activity: 8-22 mins  Eber Jones., OTR/L Acute Rehabilitation Services Pager [phone] Office [phone]    Jeani Hawking M 04/16/2021, 2:09 PM

## 2021-04-16 NOTE — Discharge Summary (Signed)
Name: Alexandria Potts MRN: [id number] DOB: 04-Mar-1941 80 y.o. PCP: Pcp, No  Date of Admission: 04/11/2021  9:07 PM Date of Discharge: 04/16/2021 04/16/2021 Attending Physician: No att. providers found   Discharge Diagnosis: Non displaced occipital fracture Intraparenchymal hemorrhage Subdural hematoma Subarachnoid hemorrhage Hypertension Hypothyroidism Prediabetics  Discharge Medications: Allergies as of 04/16/2021       Reactions   Penicillins Rash   Statins Other (See Comments)   Leg cramps        Medication List     STOP taking these medications    pioglitazone 15 MG tablet Commonly known as: ACTOS       TAKE these medications    acetaminophen 325 MG tablet Commonly known as: TYLENOL Take 3 tablets (975 mg total) by mouth every 8 (eight) hours as needed for mild pain (or Fever >/= 101).   amLODipine 5 MG tablet Commonly known as: NORVASC Take 5 mg by mouth daily.   fluticasone 50 MCG/ACT nasal spray Commonly known as: FLONASE Place 1 spray into both nostrils daily as needed for rhinitis.   gabapentin 300 MG capsule Commonly known as: NEURONTIN Take 1 capsule (300 mg total) by mouth at bedtime.   latanoprost 0.005 % ophthalmic solution Commonly known as: XALATAN Place 1 drop into both eyes daily.   levothyroxine 75 MCG tablet Commonly known as: SYNTHROID Take 75 mcg by mouth daily.   metFORMIN 500 MG 24 hr tablet Commonly known as: GLUCOPHAGE-XR Take 500 mg by mouth daily.   naproxen 500 MG tablet Commonly known as: Naprosyn Take 1 tablet (500 mg total) by mouth 2 (two) times daily with a meal for 7 days.   ondansetron 4 MG tablet Commonly known as: Zofran Take 1 tablet (4 mg total) by mouth daily as needed for nausea or vomiting.               Durable Medical Equipment  (From admission, onward)           Start     Ordered   04/14/21 1315  For home use only DME Bedside commode  Once       Question:  Patient needs a  bedside commode to treat with the following condition  Answer:  Subarachnoid bleed (HCC)   04/14/21 1314   04/14/21 1313  For home use only DME Walker rolling  Once       Question Answer Comment  Walker: With 5 Inch Wheels   Patient needs a walker to treat with the following condition Subarachnoid bleed (HCC)      04/14/21 1314            Disposition and follow-up:   Ms.Alexandria Potts was discharged from [facility] in Stable condition.  At the hospital follow up visit please address:  1.  Follow up: Follow up with your PCP in 1 week. Follow up with Neurosurgery Dr. Venetia Maxon in 2 weeks.  F/U with [facility] for Rt hand weakness. Pt has upcoming appointment. Pt will reschedule if not able to go due to current illness.   2.  Labs / imaging needed at time of follow-up: May need follow up CT head.   3.  Pending labs/ test needing follow-up: none  Follow-up Appointments:  Follow-up Information     Maeola Harman, MD. Schedule an appointment as soon as possible for a visit in 2 week(s).   Specialty: Neurosurgery Contact information: [address] [place] [address] [phone]  [facility] Follow up.   Why: 3n1 and rolling walker arranged- to be delivered to room prior to discharge Contact information: [address] [facility] [place] [address] [phone]         [facility] Follow up.   Why: HHPT arranged- they will contact you to schedule initial visit (plan for start of care on 6/27) Contact information: [phone]                Hospital Course by problem list:  #Fall with nondisplaced occipital skull fracture #Intraparenchymal hemorrhage, 30mm Rt frontal cortex #Subdural hematoma, trace anterior Rt parafalcine #Subarachnoid hemorrhage, small bilateral frontal & Rt cerebellar Patient presents with acute SAH, SDH, and IPH. She had GCS of 15 and has the following symptoms: sleepy  appearing, headache, and nausea.  No focal deficits on neuro exam.  CT head showed nondisplaced occipital skull fracture, intraparenchymal hemorrhage 8 mm in the right frontal cortex, subdural hematoma, trace anterior right para falcine and small Subarachnoid hemorrhage,in bilateral frontal & Rt cerebellar.  Neurosurgery consulted and no emergent indication for surgical evacuation. Patient symptoms were managed with close monitoring with serial neurovascular exams, pain control and antiemetics. Repeat CT scan revealed stable hematomas.  PT and OT recommended home health OT and PT.  Pt discharged on Naproxen. Neurosurgery recommended to follow-up in 2 weeks with Dr. Venetia Maxon.  Hypertension Hypothyroidism Diabetic neuropathy Patient's home medications continued.    #Prediabetes: - CBG monitoring q4 hours Chronic Rt arm weakness Pt will follow up with [facility] for chronic right arm weakness.   Subjective on day of discharge: Pt denies any new symptoms. States she needs something for pain control and for nausea. She is ready to go home.   Discharge Exam:   BP (!) 140/57 (BP Location: Left Arm)   Pulse 60   Temp 98.1 F (36.7 C) (Oral)   Resp (!) 22   Ht 5\' 1"  (1.549 m)   Wt 68 kg   SpO2 93%   BMI 28.34 kg/m  Discharge exam:  Constitutional: Pleasant lady lying in bed comfortably. Not in acute distress. HEENT: No Icterus Cardiovascular: Regular rate and rhythm. No Murmurs/rubs/gallops. Pulmonary: Effort normal, Breathing comfortably at room air. Musculoskeletal: No edema.   Skin: Warm and dry neurological: Alert and oriented, no dysarthria, answering question appropriately.  Chronic right arm weakness.  No new focal deficits. Psychiatric: Normal mood and affect.   Pertinent Labs, Studies, and Procedures:  CT HEAD WO CONTRAST  Result Date: 04/12/2021 CLINICAL DATA:  Follow-up intracerebral hemorrhage. EXAM: CT HEAD WITHOUT CONTRAST TECHNIQUE: Contiguous axial images were obtained  from the base of the skull through the vertex without intravenous contrast. COMPARISON:  Head CT yesterday. FINDINGS: Brain: Small right frontal intraparenchymal hematoma measures 7.6 x 6.0 x 10 mm (volume = 240 mm^3), previously 7.8 x 6.3 x 9.6 cm (volume = 250 cm^3). There is increase in surrounding edema from prior exam. Adjacent right frontal as well as left frontal subarachnoid hemorrhage is not significantly changed remains small volume. The left frontal subarachnoid hemorrhage extends superiorly, unchanged. There is likely a small amount of subdural blood tracking along the right tentorium, this is unchanged. Small amount of blood along the anterior falx is also not significantly changed. Trace right cerebellar subarachnoid hemorrhage has improved. Layering blood in the occipital horns the lateral ventricles, right greater than left, increased from prior. No hydrocephalus. No acute infarct. No midline shift. Vascular: No hyperdense vessel. Skull: Nondisplaced right occipital skull fracture again seen.  Fracture extends to the jugular foramen at the skull base. Sinuses/Orbits: Trace mucosal thickening in right side of sphenoid sinus no mastoid effusion bilateral cataract resection. Other: None. IMPRESSION: 1. Essentially unchanged size of small right frontal intraparenchymal hematoma. Mild increased surrounding edema from prior exam. 2. Unchanged bilateral frontal subarachnoid hemorrhage. Unchanged small amount of subdural blood along the right tentorium and anterior falx. 3. Increased layering blood in the occipital horns the lateral ventricles, right greater than left. No hydrocephalus. 4. Nondisplaced right occipital skull fracture. Electronically Signed   By: Narda Rutherford M.D.   On: 04/12/2021 21:31   CT Head Wo Contrast  Result Date: 04/11/2021 CLINICAL DATA:  Status post fall EXAM: CT HEAD WITHOUT CONTRAST CT CERVICAL SPINE WITHOUT CONTRAST TECHNIQUE: Multidetector CT imaging of the head and  cervical spine was performed following the standard protocol without intravenous contrast. Multiplanar CT image reconstructions of the cervical spine were also generated. COMPARISON:  MRI head 03/14/2021 FINDINGS: CT HEAD FINDINGS Brain: No evidence of large-territorial acute infarction. Right frontal cortex 8 mm intraparenchymal hemorrhage. Bilateral frontal small volume subarachnoid hemorrhage. Right cerebellar subarachnoid hemorrhage. Trace anterior thickening/hyperdensity of the falx cerebral on the right consistent with a parafalcine subdural hematoma. No mass lesion. No mass effect or midline shift. No hydrocephalus. Basilar cisterns are patent. Vascular: No hyperdense vessel. Atherosclerotic calcifications are present within the cavernous internal carotid arteries. Skull: Nondisplaced right occipital skull fracture with extension to the jugular foramen. Focal lesion. Sinuses/Orbits: Paranasal sinuses and mastoid air cells are clear. Bilateral lens replacement. Otherwise the orbits are unremarkable. Other: None. CT CERVICAL SPINE FINDINGS Alignment: Normal. Skull base and vertebrae: Multilevel degenerative changes of the spine. No acute fracture. No aggressive appearing focal osseous lesion or focal pathologic process. Soft tissues and spinal canal: No prevertebral fluid or swelling. No visible canal hematoma. Upper chest: Unremarkable. Other: None. IMPRESSION: 1. Right frontal cortex 8 mm intraparenchymal hemorrhage. 2. Bilateral frontal small volume subarachnoid hemorrhage. 3. Right cerebellar subarachnoid hemorrhage. 4. Trace right parafalcine subdural hematoma anteriorly. 5. Nondisplaced right occipital skull fracture that extends to the jugular foramen. 6. No acute displaced fracture or traumatic listhesis of the cervical spine. These results were called by telephone at the time of interpretation on 04/11/2021 at 10:13 pm to provider Dr. Effie Shy, who verbally acknowledged these results. Electronically Signed    By: Tish Frederickson M.D.   On: 04/11/2021 22:17   CT Cervical Spine Wo Contrast  Result Date: 04/11/2021 CLINICAL DATA:  Status post fall EXAM: CT HEAD WITHOUT CONTRAST CT CERVICAL SPINE WITHOUT CONTRAST TECHNIQUE: Multidetector CT imaging of the head and cervical spine was performed following the standard protocol without intravenous contrast. Multiplanar CT image reconstructions of the cervical spine were also generated. COMPARISON:  MRI head 03/14/2021 FINDINGS: CT HEAD FINDINGS Brain: No evidence of large-territorial acute infarction. Right frontal cortex 8 mm intraparenchymal hemorrhage. Bilateral frontal small volume subarachnoid hemorrhage. Right cerebellar subarachnoid hemorrhage. Trace anterior thickening/hyperdensity of the falx cerebral on the right consistent with a parafalcine subdural hematoma. No mass lesion. No mass effect or midline shift. No hydrocephalus. Basilar cisterns are patent. Vascular: No hyperdense vessel. Atherosclerotic calcifications are present within the cavernous internal carotid arteries. Skull: Nondisplaced right occipital skull fracture with extension to the jugular foramen. Focal lesion. Sinuses/Orbits: Paranasal sinuses and mastoid air cells are clear. Bilateral lens replacement. Otherwise the orbits are unremarkable. Other: None. CT CERVICAL SPINE FINDINGS Alignment: Normal. Skull base and vertebrae: Multilevel degenerative changes of the spine. No acute fracture. No aggressive appearing  focal osseous lesion or focal pathologic process. Soft tissues and spinal canal: No prevertebral fluid or swelling. No visible canal hematoma. Upper chest: Unremarkable. Other: None. IMPRESSION: 1. Right frontal cortex 8 mm intraparenchymal hemorrhage. 2. Bilateral frontal small volume subarachnoid hemorrhage. 3. Right cerebellar subarachnoid hemorrhage. 4. Trace right parafalcine subdural hematoma anteriorly. 5. Nondisplaced right occipital skull fracture that extends to the jugular  foramen. 6. No acute displaced fracture or traumatic listhesis of the cervical spine. These results were called by telephone at the time of interpretation on 04/11/2021 at 10:13 pm to provider Dr. Effie ShyWentz, who verbally acknowledged these results. Electronically Signed   By: Tish FredericksonMorgane  Naveau M.D.   On: 04/11/2021 22:17   DG Chest Port 1 View  Result Date: 04/11/2021 CLINICAL DATA:  Recent syncopal episode EXAM: PORTABLE CHEST 1 VIEW COMPARISON:  None. FINDINGS: Cardiac shadow is within normal limits. The lungs are well aerated bilaterally. Bibasilar atelectatic changes are seen. No acute bony abnormality is noted. IMPRESSION: Mild bibasilar atelectasis. Electronically Signed   By: Alcide CleverMark  Lukens M.D.   On: 04/11/2021 23:48    Discharge Instructions: Discharge Instructions     Call MD for:  difficulty breathing, headache or visual disturbances   Complete by: As directed    Call MD for:  persistant nausea and vomiting   Complete by: As directed    Call MD for:  severe uncontrolled pain   Complete by: As directed    Call MD for:  temperature >100.4   Complete by: As directed    Diet - low sodium heart healthy   Complete by: As directed    Increase activity slowly   Complete by: As directed      Follow up with your PCP in 1 week. Follow up with Neurosurgery Dr. Venetia MaxonStern in 2 weeks.  Please go the ED if you experience new symptoms such as weakness, numbness, or worsening of pain.  [facility]. Please let PCP/Specialists know of any changes in medications that were made.  Please see medications section of this packet for any medication changes.   Signed: Karsten Rooda, Samuell Knoble, MD 04/16/2021, 7:05 PM   Pager: [phone]

## 2021-04-16 NOTE — Discharge Instructions (Addendum)
Dear Alexandria Potts,   Thank you for letting us participate in your care! In this section, you will find a brief hospital admission summary of why you were admitted to the hospital, what happened during your admission, your diagnosis/diagnoses, and recommended follow up.  You were admitted because you were experiencing fall.  Your testing revealed occipital fracture and subdural, subarachnoid and Intraparenchymal hemorrhage.  You were treated with Pain medication and antinausea medication.  You were also seen by Neurosurgery. They recommended pain control and follow up with Alexandria Potts in 2 weeks.  Your symptoms improved and you were discharged from the hospital for meeting this goal.    POST-HOSPITAL & CARE INSTRUCTIONS Follow up with your PCP in 1 week. Follow up with Neurosurgery Alexandria Potts in 2 weeks.  Please go the ED if you experience new symptoms such as weakness, numbness, or worsening of pain.  F/U with [facility] for Rt hand weakness. Please let PCP/Specialists know of any changes in medications that were made.  Please see medications section of this packet for any medication changes.   DOCTOR'S APPOINTMENTS & FOLLOW UP No future appointments.   Thank you for choosing [facility]! Take care and be well!  Internal Medicine Teaching Service Inpatient Team [place] [facility]  [facility]  [address] [place], [place] [address] [phone]

## 2021-09-20 ENCOUNTER — Encounter (HOSPITAL_COMMUNITY): Payer: Self-pay | Admitting: *Deleted

## 2021-09-20 ENCOUNTER — Emergency Department (HOSPITAL_COMMUNITY)
Admission: EM | Admit: 2021-09-20 | Discharge: 2021-09-21 | Disposition: A | Discharge status: Home / Self Care | Payer: Medicare Other | Attending: Emergency Medicine | Admitting: Emergency Medicine

## 2021-09-20 ENCOUNTER — Other Ambulatory Visit: Payer: Self-pay

## 2021-09-20 DIAGNOSIS — Z87891 Personal history of nicotine dependence: Secondary | ICD-10-CM | POA: Diagnosis not present

## 2021-09-20 DIAGNOSIS — Y92009 Unspecified place in unspecified non-institutional (private) residence as the place of occurrence of the external cause: Secondary | ICD-10-CM | POA: Insufficient documentation

## 2021-09-20 DIAGNOSIS — E039 Hypothyroidism, unspecified: Secondary | ICD-10-CM | POA: Diagnosis not present

## 2021-09-20 DIAGNOSIS — S01511A Laceration without foreign body of lip, initial encounter: Secondary | ICD-10-CM | POA: Diagnosis not present

## 2021-09-20 DIAGNOSIS — Z79899 Other long term (current) drug therapy: Secondary | ICD-10-CM | POA: Insufficient documentation

## 2021-09-20 DIAGNOSIS — W01198A Fall on same level from slipping, tripping and stumbling with subsequent striking against other object, initial encounter: Secondary | ICD-10-CM | POA: Diagnosis not present

## 2021-09-20 DIAGNOSIS — S0993XA Unspecified injury of face, initial encounter: Secondary | ICD-10-CM

## 2021-09-20 DIAGNOSIS — S0990XA Unspecified injury of head, initial encounter: Secondary | ICD-10-CM | POA: Insufficient documentation

## 2021-09-20 DIAGNOSIS — S022XXA Fracture of nasal bones, initial encounter for closed fracture: Secondary | ICD-10-CM

## 2021-09-20 HISTORY — DX: Amyotrophic lateral sclerosis: G12.21

## 2021-09-20 NOTE — ED Provider Notes (Signed)
[facility] EMERGENCY DEPARTMENT Provider Note   CSN: [id number] Arrival date & time: 09/20/21  2129     History Chief Complaint  Patient presents with   Alexandria Potts    Alexandria Potts is a 80 y.o. female.  Presenting to the emergency room with concern for fall.  Patient fell at home today and landed on the right side of her head.  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  Patient thinks that she may have passed out after hitting her head.  Daughter states patient is currently at baseline.  Daughter reports that patient suffered skull fracture and brain bleed in June.  Per review of chart patient had traumatic subdural and subarachnoid hemorrhage and occipital bone fracture.  Patient denies any headache nausea or vomiting.  History obtained from patient, daughter and chart review.  Not on blood thinners.  HPI     Past Medical History:  Diagnosis Date   ALS (amyotrophic lateral sclerosis) (HCC)    Carpal tunnel syndrome    right   Hypothyroidism     Patient Active Problem List   Diagnosis Date Noted   Subdural hematoma 04/12/2021   Fracture of occipital bone of skull with loss of consciousness (HCC) 04/12/2021   Subarachnoid hemorrhage (HCC) 04/12/2021   Intraparenchymal hemorrhage of brain (HCC) 04/12/2021   Hypothyroidism 04/12/2021   Right arm weakness 03/08/2021    Past Surgical History:  Procedure Laterality Date   APPENDECTOMY  1995   CARPAL TUNNEL RELEASE Right 10/2019   ELBOW SURGERY Right 10/2019   HYSTERECTOMY     TONSILLECTOMY     as a child     OB History   No obstetric history on file.     Family History  Problem Relation Age of Onset   Heart Problems Mother    Other Mother        "lung problems"   Heart Problems Father    Other Father        "lung problems"   Diabetes Other        both sides     Social History   Tobacco Use   Smoking status: Former    Packs/day: 0.50    Years: 3.00    Pack years: 1.50     Types: Cigarettes   Smokeless tobacco: Never   Tobacco comments:    in her early 67s  Vaping Use   Vaping Use: Never used  Substance Use Topics   Alcohol use: Never   Drug use: Never    Home Medications Prior to Admission medications   Medication Sig Start Date End Date Taking? Authorizing Provider  acetaminophen (TYLENOL) 325 MG tablet Take 3 tablets (975 mg total) by mouth every 8 (eight) hours as needed for mild pain (or Fever >/= 101). 04/16/21   Karsten Ro, MD  amLODipine (NORVASC) 5 MG tablet Take 5 mg by mouth daily.    [provider]  fluticasone (FLONASE) 50 MCG/ACT nasal spray Place 1 spray into both nostrils daily as needed for rhinitis.    [provider]  gabapentin (NEURONTIN) 300 MG capsule Take 1 capsule (300 mg total) by mouth at bedtime. 04/16/21   Karsten Ro, MD  latanoprost (XALATAN) 0.005 % ophthalmic solution Place 1 drop into both eyes daily.    [provider]  levothyroxine (SYNTHROID) 75 MCG tablet Take 75 mcg by mouth daily.    [provider]  metFORMIN (GLUCOPHAGE-XR) 500 MG 24 hr tablet Take 500 mg by  mouth daily. 03/26/21   [provider]  ondansetron (ZOFRAN) 4 MG tablet Take 1 tablet (4 mg total) by mouth daily as needed for nausea or vomiting. 04/16/21 04/16/22  Armando Reichert, MD    Allergies    Penicillins and Statins  Review of Systems   Review of Systems  Constitutional:  Negative for chills and fever.  HENT:  Positive for dental problem. Negative for ear pain and sore throat.   Eyes:  Negative for pain and visual disturbance.  Respiratory:  Negative for cough and shortness of breath.   Cardiovascular:  Negative for chest pain and palpitations.  Gastrointestinal:  Negative for abdominal pain and vomiting.  Genitourinary:  Negative for dysuria and hematuria.  Musculoskeletal:  Negative for arthralgias and back pain.  Skin:  Negative for color change and rash.  Neurological:  Negative for seizures  and syncope.  All other systems reviewed and are negative.  Physical Exam Updated Vital Signs BP (!) 162/70 (BP Location: Left Arm)   Pulse 90   Temp 98.1 F (36.7 C) (Oral)   Resp 18   Ht 5\' 2"  (1.575 m)   Wt 65.8 kg   SpO2 95%   BMI 26.52 kg/m   Physical Exam Vitals and nursing note reviewed.  Constitutional:      General: She is not in acute distress.    Appearance: She is well-developed.  HENT:     Head: Normocephalic.     Comments: Mild swelling, tenderness to palpation to right face, there is approximately 0.5-1 cm laceration on the upper lip in the buccal mucosa, no involvement of vermilion border; missing front right tooth, no active bleeding noted Eyes:     Conjunctiva/sclera: Conjunctivae normal.  Cardiovascular:     Rate and Rhythm: Normal rate and regular rhythm.     Heart sounds: No murmur heard. Pulmonary:     Effort: Pulmonary effort is normal. No respiratory distress.     Breath sounds: Normal breath sounds.  Abdominal:     Palpations: Abdomen is soft.     Tenderness: There is no abdominal tenderness.  Musculoskeletal:        General: No swelling.     Cervical back: Neck supple.     Comments: Back: no C, T, L spine TTP, no step off or deformity RUE: no TTP throughout, no deformity, normal joint ROM, radial pulse intact, distal sensation and motor intact LUE: no TTP throughout, no deformity, normal joint ROM, radial pulse intact, distal sensation and motor intact RLE:  no TTP throughout, no deformity, normal joint ROM, distal pulse, sensation and motor intact LLE: no TTP throughout, no deformity, normal joint ROM, distal pulse, sensation and motor intact  Skin:    General: Skin is warm and dry.     Capillary Refill: Capillary refill takes less than 2 seconds.  Neurological:     Mental Status: She is alert.     Comments: Alert, at baseline  Psychiatric:        Mood and Affect: Mood normal.    ED Results / Procedures / Treatments   Labs (all labs  ordered are listed, but only abnormal results are displayed) Labs Reviewed - No data to display  EKG None  Radiology No results found.  Procedures Procedures   Medications Ordered in ED Medications - No data to display  ED Course  I have reviewed the triage vital signs and the nursing notes.  Pertinent labs & imaging results that were available during my care of  the patient were reviewed by me and considered in my medical decision making (see chart for details).    MDM Rules/Calculators/A&P                           80 year old lady presents to ER after reported mechanical ground-level fall.  Concern for facial trauma.  Does have some tenderness and swelling over the right side of her face.  She also has a small laceration on the inside of her right upper lip, also appears to have lost right upper tooth during fall.  Based on location of the laceration, feel this will heal well by secondary intention and does not require suture repair.  Because patient states that she passed out after the fall, recommended obtaining basic lab work and EKG however patient and daughter declined this testing, states the only desire to check the CT scan.  CT not available any pain currently.  Discussed the case with Dr. Ayesha Rumpf at [facility].  Patient will be transferred ER to ER.  Discussed risks/benefits of POV versus ambulance transport, patient and daughter both declined transport services and will go POV. If CT imaging negative, anticipate discharge from ER.   Final Clinical Impression(s) / ED Diagnoses Final diagnoses:  Facial injury, initial encounter  Lip laceration, initial encounter    Rx / DC Orders ED Discharge Orders     None        Lucrezia Starch, MD 09/21/21 [phone]

## 2021-09-20 NOTE — ED Triage Notes (Signed)
Pt fell and landed on her right side of her head; pt fell in June and had a bleed on the brain and pt's daughter wanted to make sure no bleed today

## 2021-09-21 ENCOUNTER — Emergency Department (HOSPITAL_COMMUNITY): Payer: Medicare Other

## 2021-09-21 DIAGNOSIS — S01511A Laceration without foreign body of lip, initial encounter: Secondary | ICD-10-CM | POA: Diagnosis not present

## 2021-09-21 IMAGING — CT CT MAXILLOFACIAL W/O CM
3 series · 14 of 47 positions shown, 16 images · non-contrast
Comparison: The head CT dated [DATE], cervical spine CT
[DATE]

CLINICAL DATA: Right-sided facial and head trauma.  Fell.

EXAM:
CT HEAD WITHOUT CONTRAST
CT MAXILLOFACIAL WITHOUT CONTRAST
CT CERVICAL SPINE WITHOUT CONTRAST
TECHNIQUE: Multidetector CT imaging of the head, cervical spine, and
maxillofacial structures were performed using the standard protocol
without intravenous contrast. Multiplanar CT image reconstructions
of the cervical spine and maxillofacial structures were also
generated.

[Series 3: facialbone 2.0 (person_name) (person_name) · axial · 0.38mm/px · z∈[-218,-94]mm · 8 of 72 slices shown, 10 images]
[im 5/72  brain]
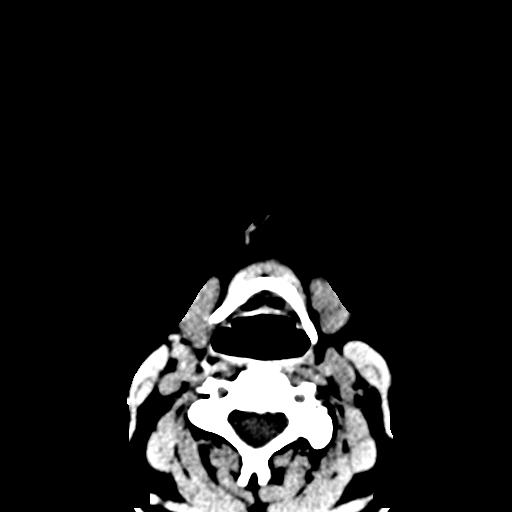
[im 5/72  bone]
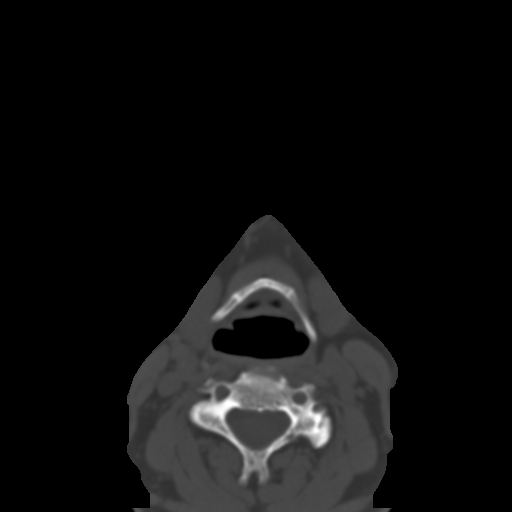
[im 15/72  bone]
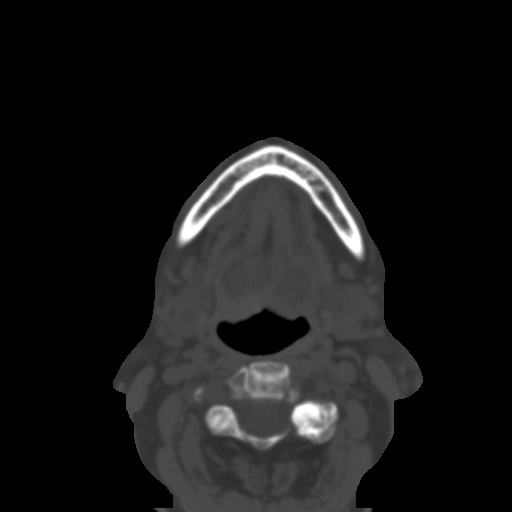
[im 23/72  bone]
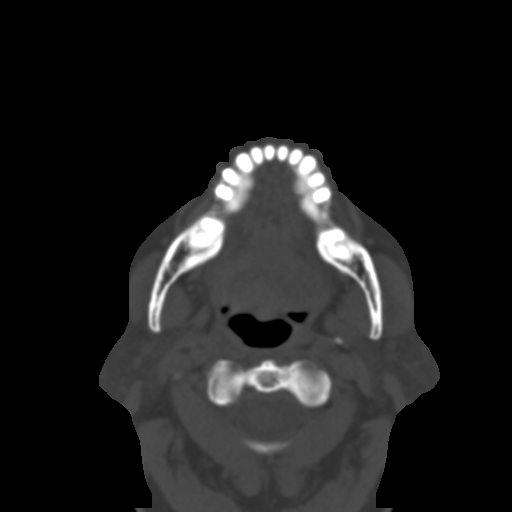
[im 32/72  bone]
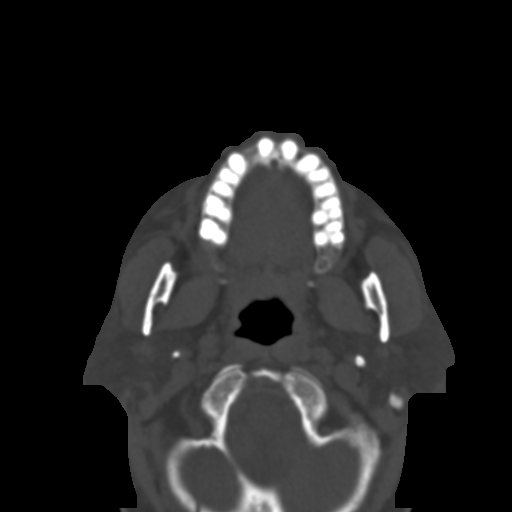
[im 40/72  brain]
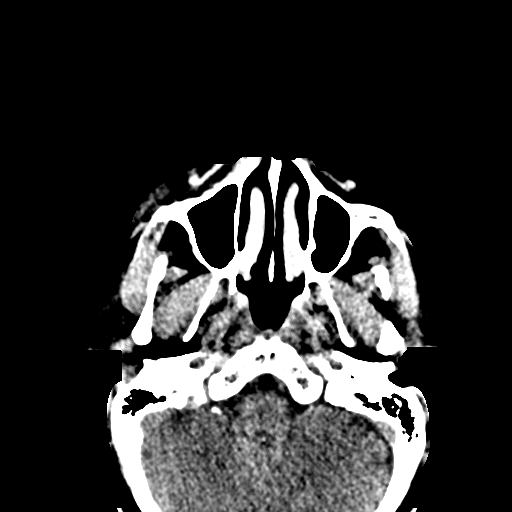
[im 40/72  bone]
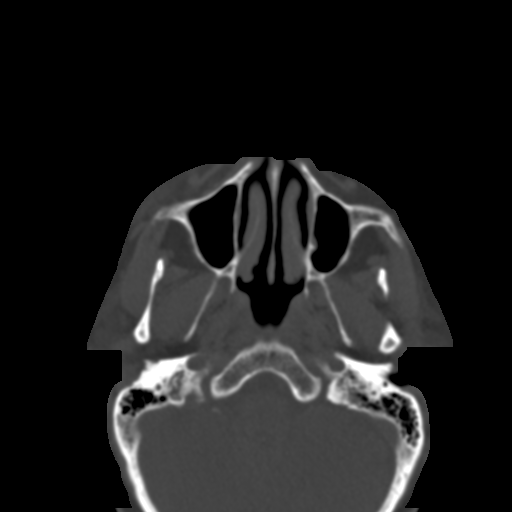
[im 49/72  bone]
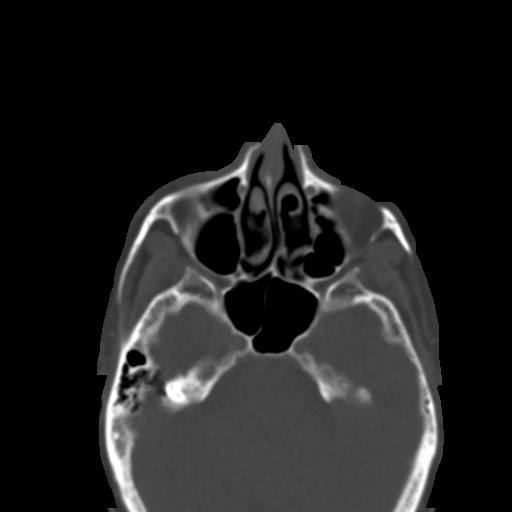
[im 57/72  bone]
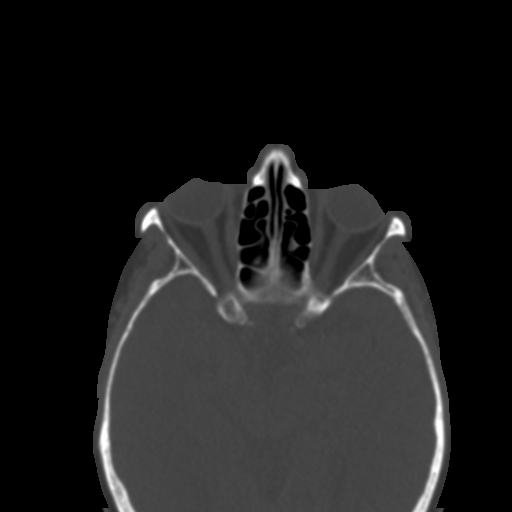
[im 67/72  bone]
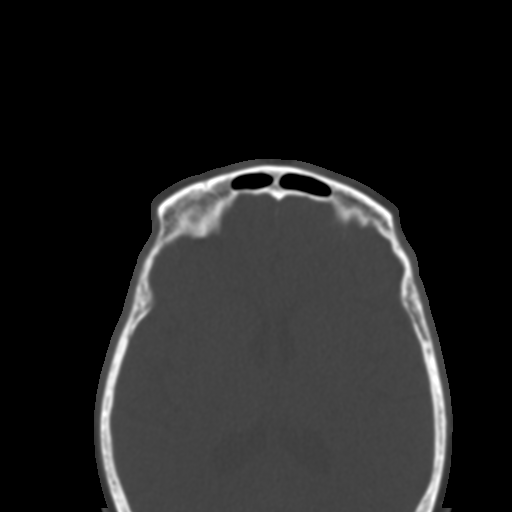

[Series 9: facialbone 2.0 cor st · coronal · 0.34mm/px · 3 of 83 slices shown]
[im 28/83  bone]
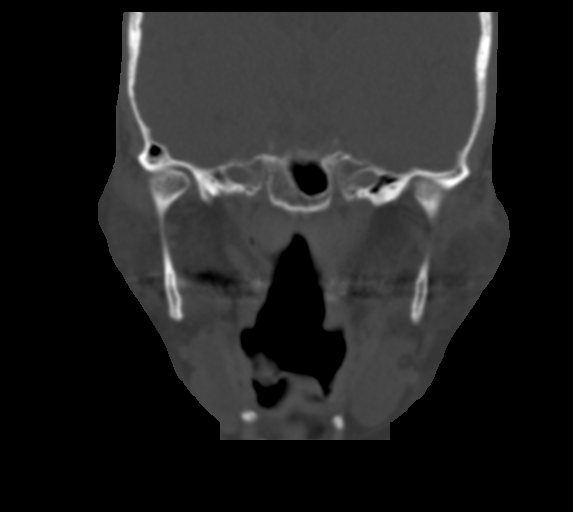
[im 37/83  bone]
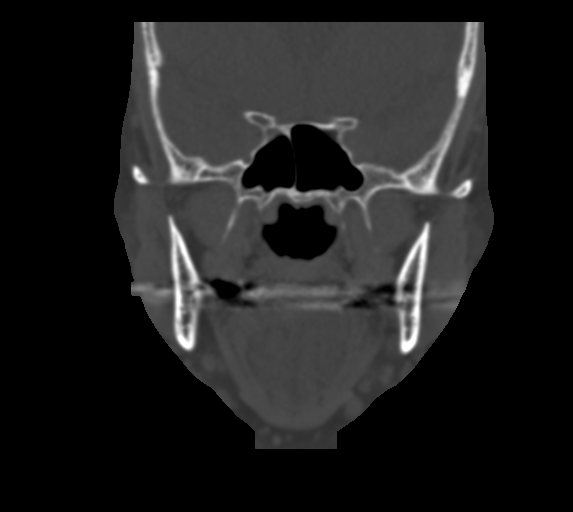
[im 46/83  bone]
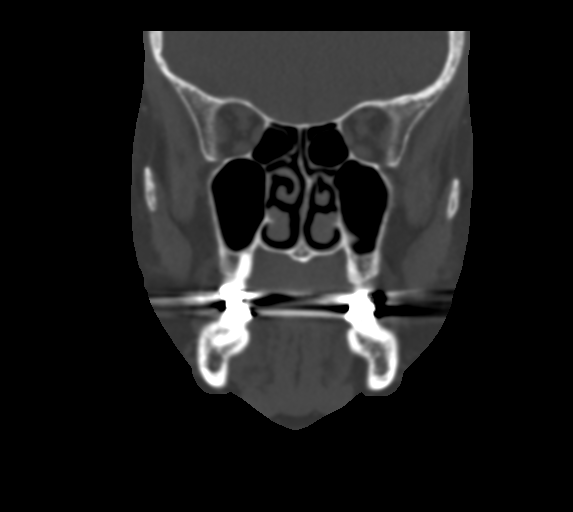

[Series 10: facialbone 2.0 sag st · sagittal · 0.30mm/px · 3 of 85 slices shown]
[im 29/85  bone]
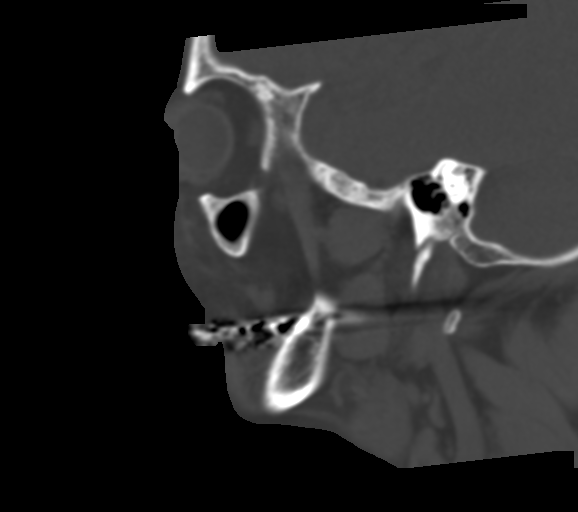
[im 43/85  bone]
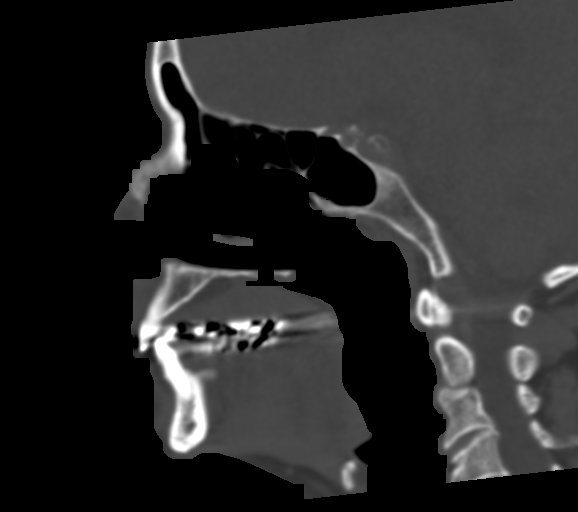
[im 57/85  bone]
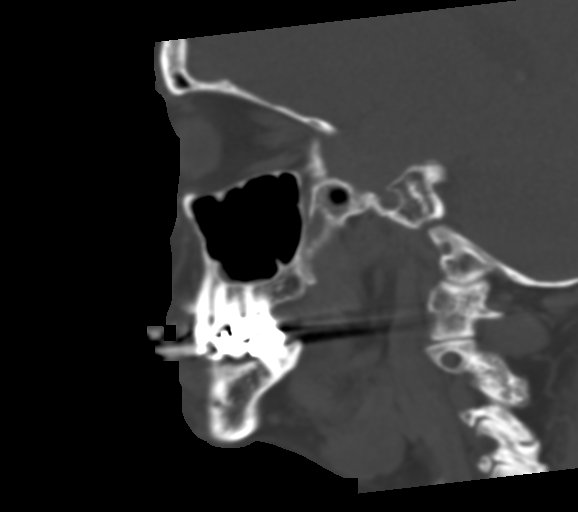

[14 of 47 positions shown; findings below may reference images not displayed]

FINDINGS: CT HEAD FINDINGS

Brain: Interval resolution noted of the previous small left frontal
parenchymal hematoma and left frontal subarachnoid bleed. Small para
foramen tentorial subdural bleed has also resolved. Minimal anterior
falcine bleed is also resolved. There is mild atrophy and small
vessel disease without evidence of cortical based infarct,
hemorrhage, mass or mass effect, ventriculomegaly or midline shift.

Vascular: There calcifications of the carotid siphons and distal
vertebral arteries but no hyperdense central vessels.

Skull: AP oriented nondisplaced right occipital bone skull fracture
again seen with extension to the right jugular foramen at the skull
base. It is unchanged in appearance. There is no new fracture.
Osteopenia.

Other: None.

CT MAXILLOFACIAL FINDINGS

Osseous: There is a slightly depressed fracture of the tip of the
nasal bone anteriorly, and a comminuted mildly depressed fracture of
the right side of the nasal bone. There are no further appreciable
facial fractures, including the sinus walls, mandible and teeth.
Mild osteopenia. Nondisplaced longitudinal right occipital skull
fracture is again noted but only partially visible , not grossly
changed in appearance since prior study.

Orbits: The orbits and bilateral orbital contents are intact. Old
lens extractions are again noted.

Sinuses: There is mild membrane thickening in the paranasal sinuses
without fluid level nor mastoid effusion. The middle ear cavities
are clear. The nasal septum is intact and is S shaped with left
septal spurring remodeling neck of the left middle turbinate. Both
of the ostiomeatal complexes are patent.

Soft tissues: There is soft tissue swelling overlying the nose,
greater on the right. There is edema in the right cheek likely also
traumatic etiology. There is mild swelling in the right preorbital
soft tissues but no intraorbital edema or fluid collection. Both
parotid glands are fatty replaced. The epiglottis and tongue base
are normal and the tonsils are not enlarged.

CT CERVICAL SPINE FINDINGS

Alignment: There is minimal grade 1 C7-T1 anterolisthesis which was
seen previously likely degenerative. Alignment is otherwise normal.

Skull base and vertebrae: Nondisplaced right occipital bone skull
fracture is again noted and unchanged in appearance. There is
osteopenia without evidence of cervical fracture.

Soft tissues and spinal canal: No prevertebral fluid or swelling. No
visible canal hematoma.

Disc levels: The discs are mildly degenerated at C2-3 and C7-T1 ,
moderately degenerated at C4-5 and C5-6 and there bidirectional
osteophytes from C3-4 through C6-7, mildly encroaching on the
ventral cord surface causing mild spinal canal stenosis.

There is bone-on-bone joint space loss and moderate osteophytosis at
the anterior atlantodental joint. There are facet and uncinate
osteophytes at most levels, with foraminal stenosis which is
moderate on the left at C2-3, moderate to severe bilaterally at
C3-4, bilaterally moderate C4-5, moderate to severe on the right at
C5-6, mild on the left and moderate on the right at C6-7.

Upper chest: Negative.

Other: There are moderate bilateral calcifications of the carotid
bifurcations. No thyroid mass.
IMPRESSION: 1. No acute intracranial CT findings with interval resolution of the
bilateral intracranial bleeds described previously.
2. Right occipital skull fracture is again noted, nondisplaced with
inferior aspect extending through the right jugular foramen at the
skull base.
3. Slightly depressed fracture of the midline nasal bone tip and
mildly displaced comminution fracture of the right side of the nasal
bone. Areas of facial soft tissue swelling without further visible
facial fracture.
4. Sinus membrane disease, with S shaped nasal septum.
5. Osteopenia and degenerative change of the cervical spine without
evidence of fractures or traumatic listhesis. Chronic degenerative
grade 1 listhesis at C7-T1.

## 2021-09-21 IMAGING — CT CT CERVICAL SPINE W/O CM
4 of 8 series · 13 of 33 positions shown, 14 images · non-contrast
Comparison: The head CT dated [DATE], cervical spine CT
[DATE]

CLINICAL DATA: Right-sided facial and head trauma.  Fell.

EXAM:
CT HEAD WITHOUT CONTRAST
CT MAXILLOFACIAL WITHOUT CONTRAST
CT CERVICAL SPINE WITHOUT CONTRAST
TECHNIQUE: Multidetector CT imaging of the head, cervical spine, and
maxillofacial structures were performed using the standard protocol
without intravenous contrast. Multiplanar CT image reconstructions
of the cervical spine and maxillofacial structures were also
generated.

[Series 4: c_spine 2.0 (person_name) (person_name) · axial · 0.32mm/px · z∈[-236,-168]mm · 3 of 69 slices shown, 4 images]
[im 18/69  soft-tissue]
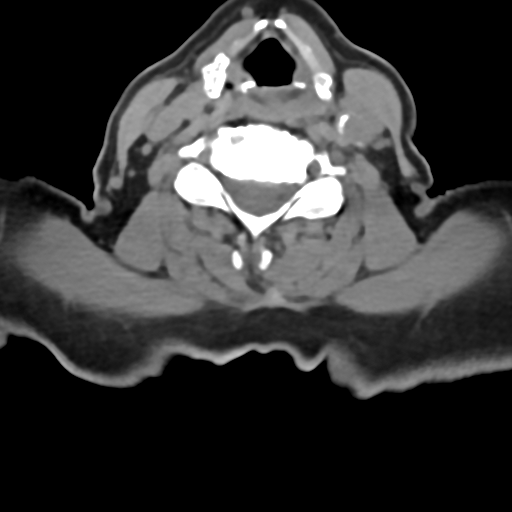
[im 18/69  bone]
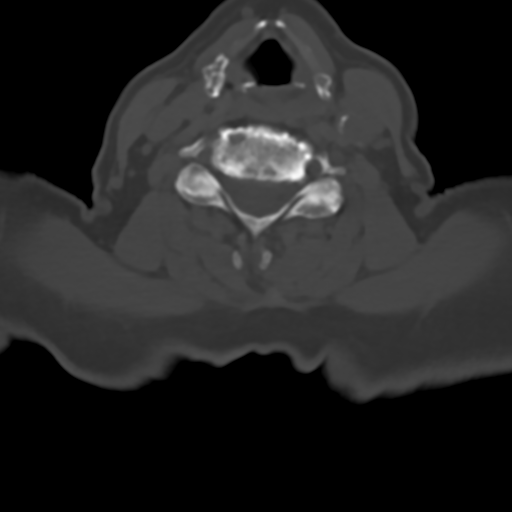
[im 35/69  bone]
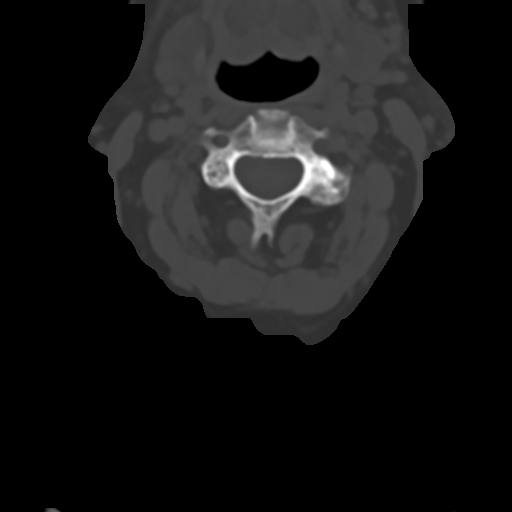
[im 52/69  bone]
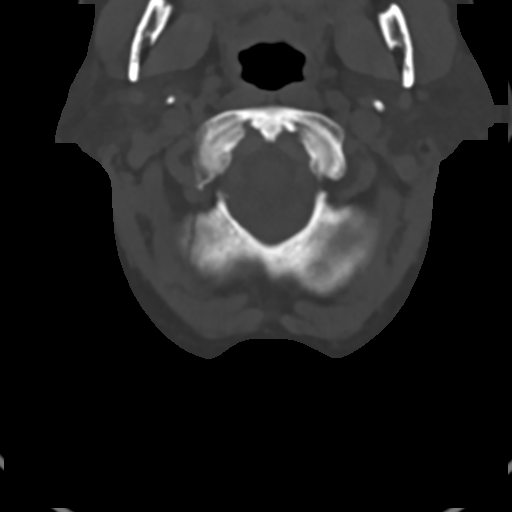

[Series 11: c_spine 2.0 sag bone · sagittal · 0.19mm/px · 5 of 63 slices shown]
[im 11/63  bone]
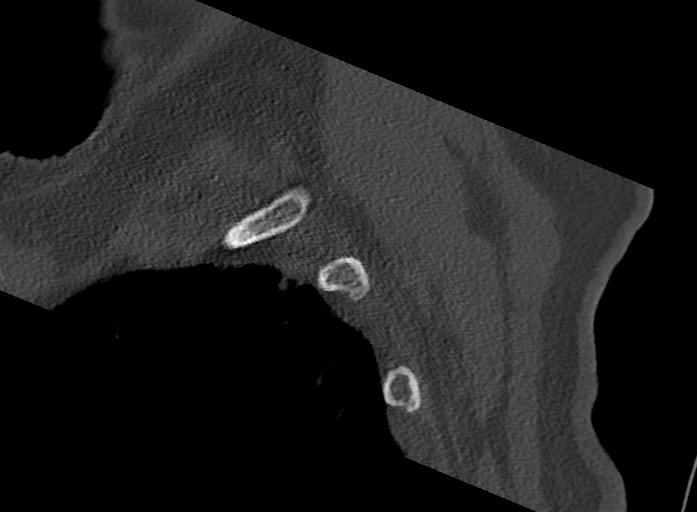
[im 21/63  bone]
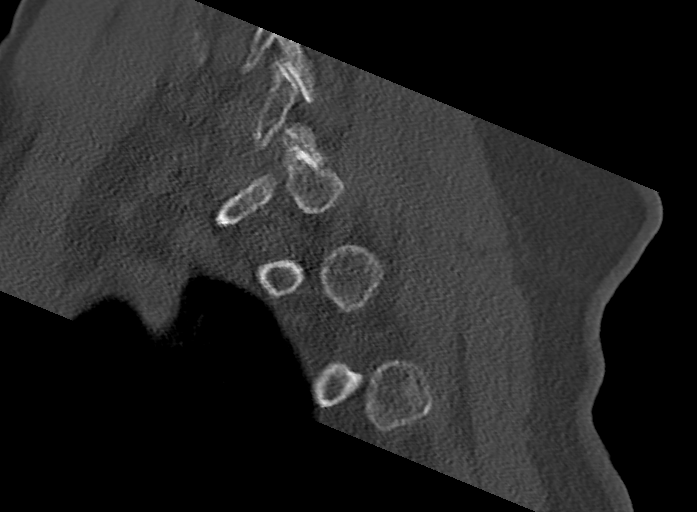
[im 32/63  bone]
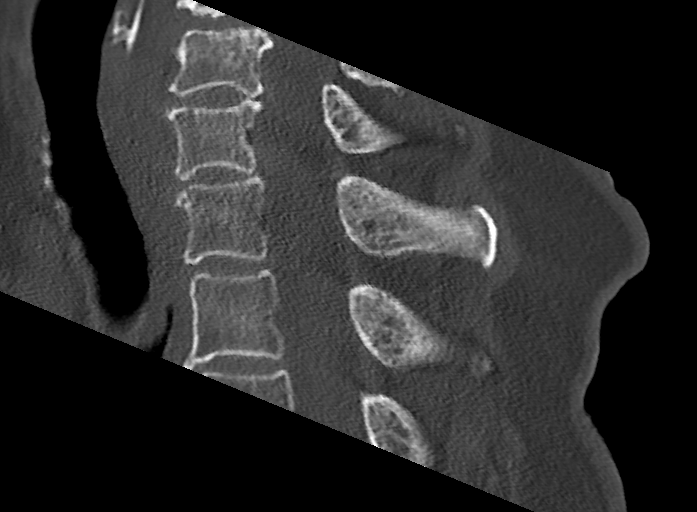
[im 42/63  bone]
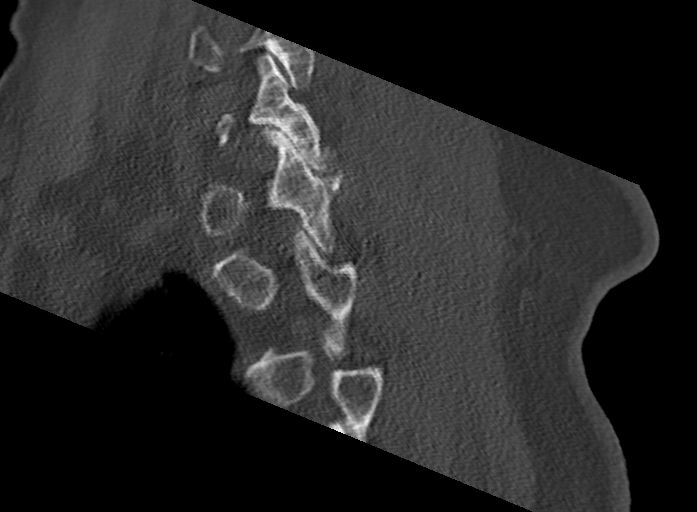
[im 52/63  bone]
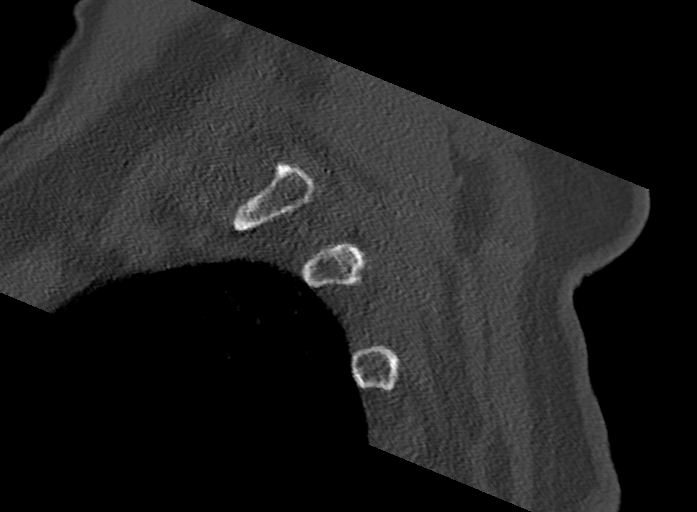

[Series 17: c_spine 2.0 cor bone · coronal · 0.23mm/px · 3 of 61 slices shown]
[im 16/61  bone]
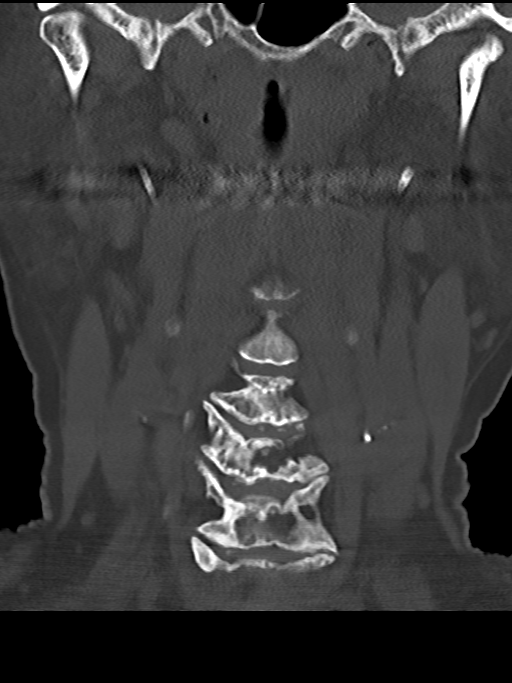
[im 31/61  bone]
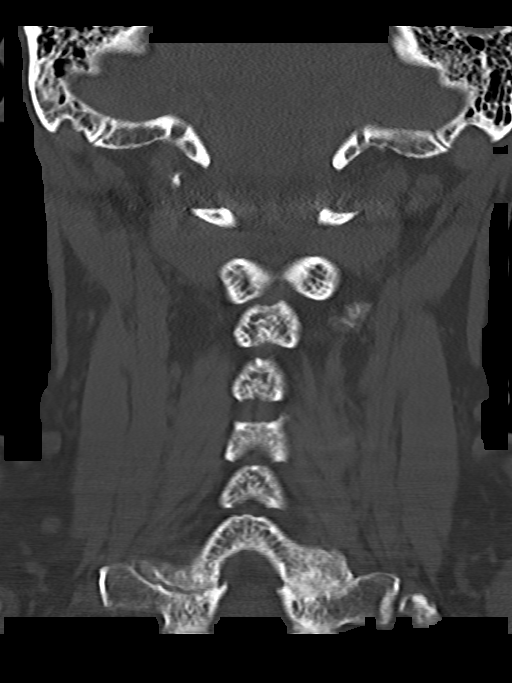
[im 46/61  bone]
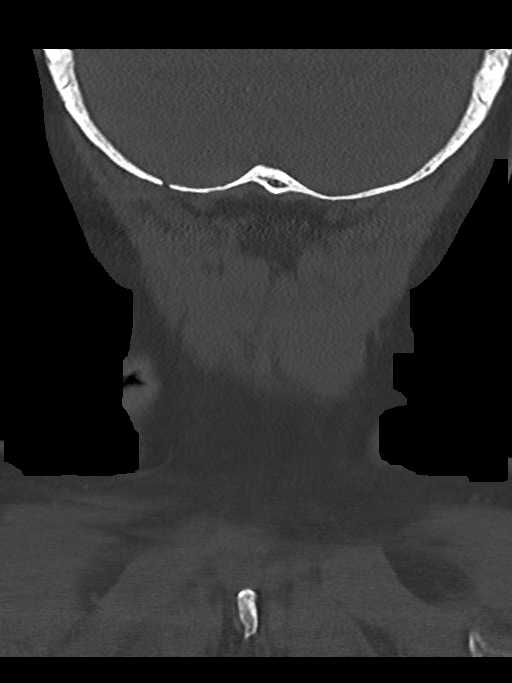

[Series 18: c_spine 2.0 orthogonals · axial · 0.21mm/px · z∈[-252,-218]mm · 2 of 67 slices shown]
[im 17/67  bone]
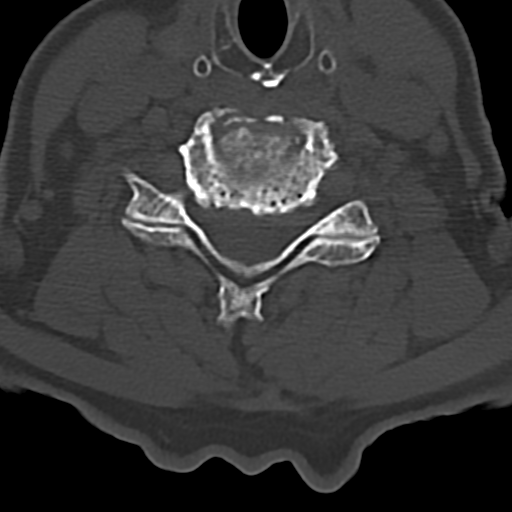
[im 34/67  bone]
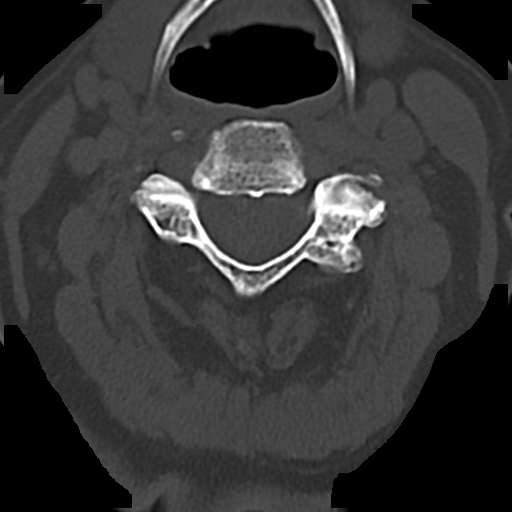

[13 of 33 positions shown; findings below may reference images not displayed]

FINDINGS: CT HEAD FINDINGS

Brain: Interval resolution noted of the previous small left frontal
parenchymal hematoma and left frontal subarachnoid bleed. Small para
foramen tentorial subdural bleed has also resolved. Minimal anterior
falcine bleed is also resolved. There is mild atrophy and small
vessel disease without evidence of cortical based infarct,
hemorrhage, mass or mass effect, ventriculomegaly or midline shift.

Vascular: There calcifications of the carotid siphons and distal
vertebral arteries but no hyperdense central vessels.

Skull: AP oriented nondisplaced right occipital bone skull fracture
again seen with extension to the right jugular foramen at the skull
base. It is unchanged in appearance. There is no new fracture.
Osteopenia.

Other: None.

CT MAXILLOFACIAL FINDINGS

Osseous: There is a slightly depressed fracture of the tip of the
nasal bone anteriorly, and a comminuted mildly depressed fracture of
the right side of the nasal bone. There are no further appreciable
facial fractures, including the sinus walls, mandible and teeth.
Mild osteopenia. Nondisplaced longitudinal right occipital skull
fracture is again noted but only partially visible , not grossly
changed in appearance since prior study.

Orbits: The orbits and bilateral orbital contents are intact. Old
lens extractions are again noted.

Sinuses: There is mild membrane thickening in the paranasal sinuses
without fluid level nor mastoid effusion. The middle ear cavities
are clear. The nasal septum is intact and is S shaped with left
septal spurring remodeling neck of the left middle turbinate. Both
of the ostiomeatal complexes are patent.

Soft tissues: There is soft tissue swelling overlying the nose,
greater on the right. There is edema in the right cheek likely also
traumatic etiology. There is mild swelling in the right preorbital
soft tissues but no intraorbital edema or fluid collection. Both
parotid glands are fatty replaced. The epiglottis and tongue base
are normal and the tonsils are not enlarged.

CT CERVICAL SPINE FINDINGS

Alignment: There is minimal grade 1 C7-T1 anterolisthesis which was
seen previously likely degenerative. Alignment is otherwise normal.

Skull base and vertebrae: Nondisplaced right occipital bone skull
fracture is again noted and unchanged in appearance. There is
osteopenia without evidence of cervical fracture.

Soft tissues and spinal canal: No prevertebral fluid or swelling. No
visible canal hematoma.

Disc levels: The discs are mildly degenerated at C2-3 and C7-T1 ,
moderately degenerated at C4-5 and C5-6 and there bidirectional
osteophytes from C3-4 through C6-7, mildly encroaching on the
ventral cord surface causing mild spinal canal stenosis.

There is bone-on-bone joint space loss and moderate osteophytosis at
the anterior atlantodental joint. There are facet and uncinate
osteophytes at most levels, with foraminal stenosis which is
moderate on the left at C2-3, moderate to severe bilaterally at
C3-4, bilaterally moderate C4-5, moderate to severe on the right at
C5-6, mild on the left and moderate on the right at C6-7.

Upper chest: Negative.

Other: There are moderate bilateral calcifications of the carotid
bifurcations. No thyroid mass.
IMPRESSION: 1. No acute intracranial CT findings with interval resolution of the
bilateral intracranial bleeds described previously.
2. Right occipital skull fracture is again noted, nondisplaced with
inferior aspect extending through the right jugular foramen at the
skull base.
3. Slightly depressed fracture of the midline nasal bone tip and
mildly displaced comminution fracture of the right side of the nasal
bone. Areas of facial soft tissue swelling without further visible
facial fracture.
4. Sinus membrane disease, with S shaped nasal septum.
5. Osteopenia and degenerative change of the cervical spine without
evidence of fractures or traumatic listhesis. Chronic degenerative
grade 1 listhesis at C7-T1.

## 2021-09-21 IMAGING — CT CT HEAD W/O CM
3 of 4 series · 13 of 47 positions shown, 15 images · non-contrast
Comparison: The head CT dated [DATE], cervical spine CT
[DATE]

CLINICAL DATA: Right-sided facial and head trauma.  Fell.

EXAM:
CT HEAD WITHOUT CONTRAST
CT MAXILLOFACIAL WITHOUT CONTRAST
CT CERVICAL SPINE WITHOUT CONTRAST
TECHNIQUE: Multidetector CT imaging of the head, cervical spine, and
maxillofacial structures were performed using the standard protocol
without intravenous contrast. Multiplanar CT image reconstructions
of the cervical spine and maxillofacial structures were also
generated.

[Series 3: head without · axial · non-contrast · 0.43mm/px · z∈[-152,-32]mm · 7 of 32 slices shown, 9 images]
[im 4/32  brain]
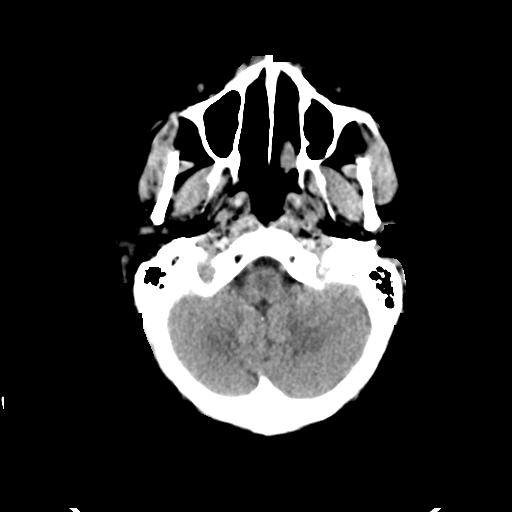
[im 4/32  bone]
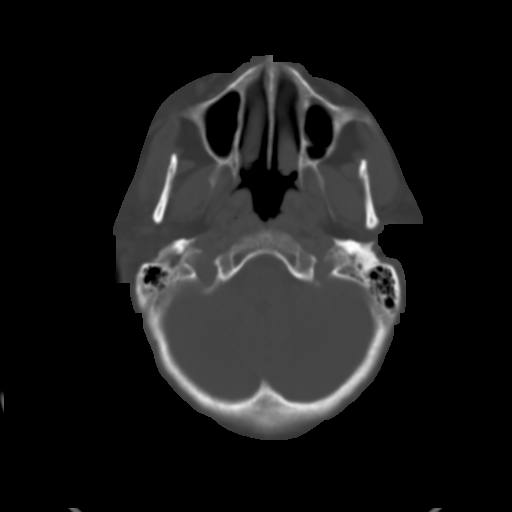
[im 8/32  brain]
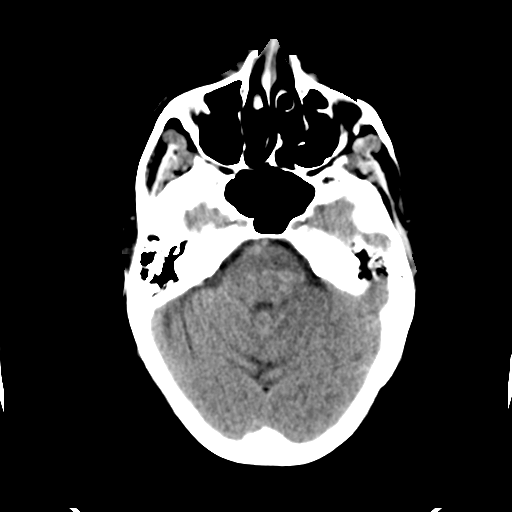
[im 12/32  brain]
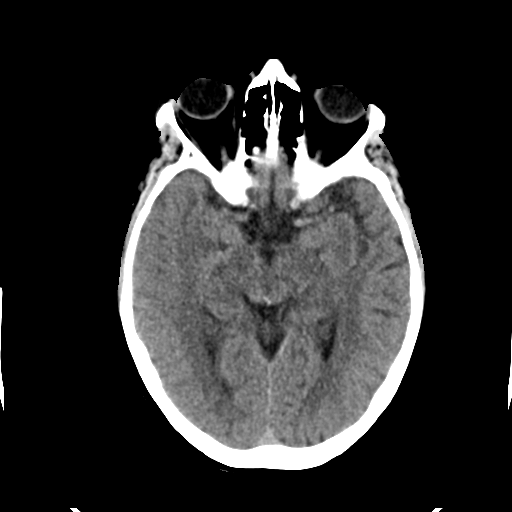
[im 16/32  brain]
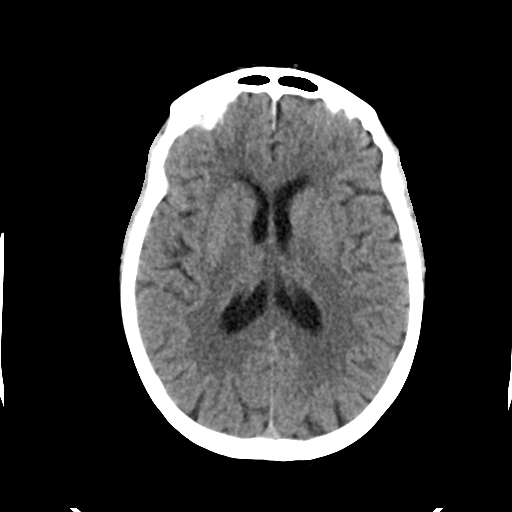
[im 20/32  brain]
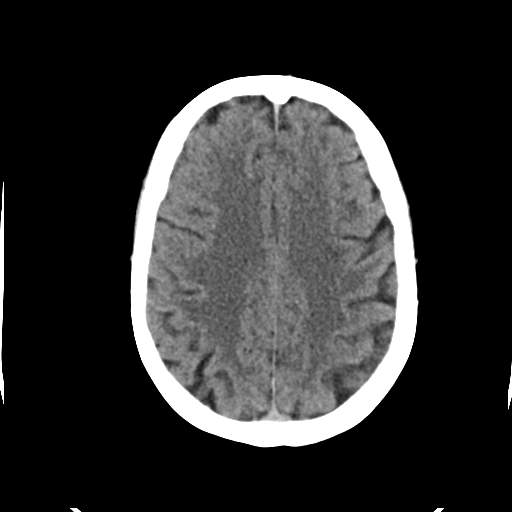
[im 20/32  bone]
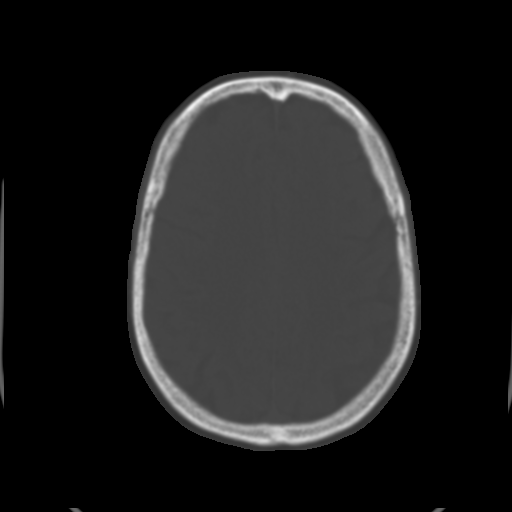
[im 24/32  brain]
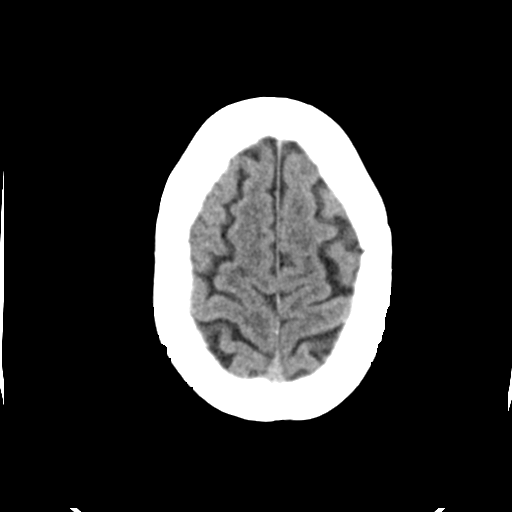
[im 28/32  brain]
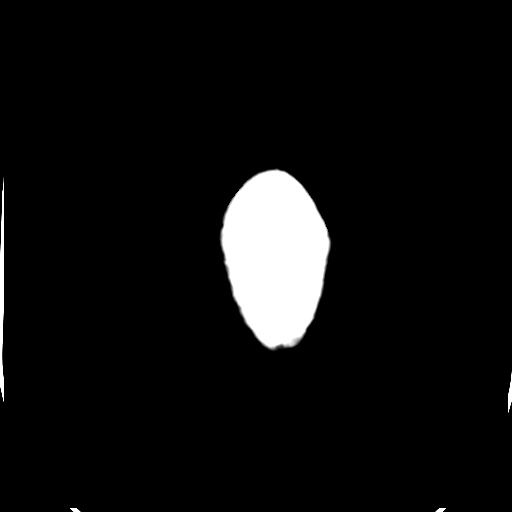

[Series 5: head without cor · coronal · non-contrast · 0.31mm/px · 3 of 67 slices shown]
[im 23/67  brain]
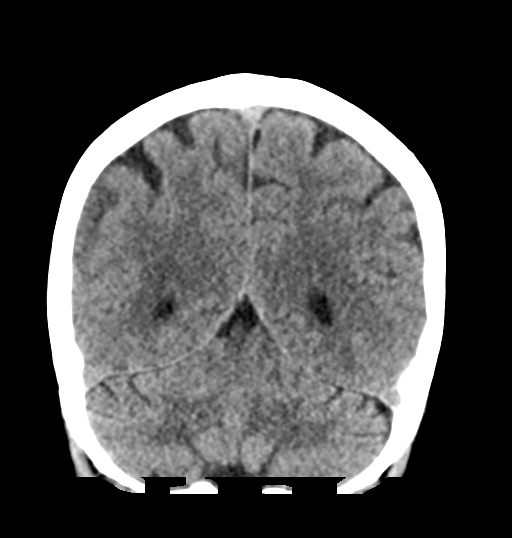
[im 30/67  brain]
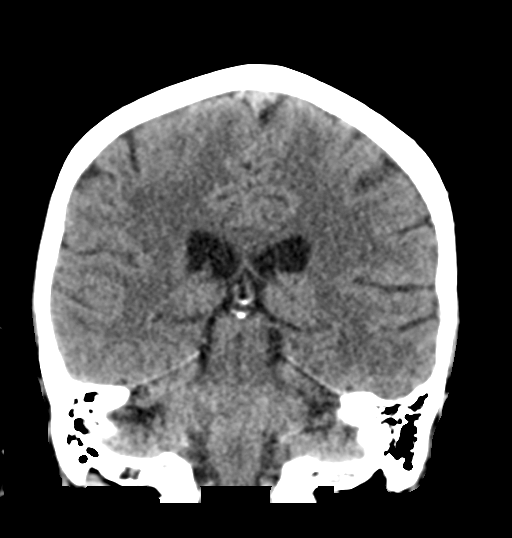
[im 37/67  brain]
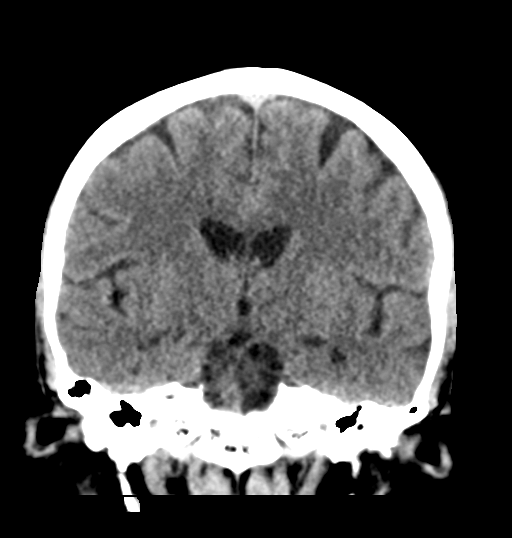

[Series 6: head without sag · sagittal · non-contrast · 0.32mm/px · 3 of 52 slices shown]
[im 18/52  brain]
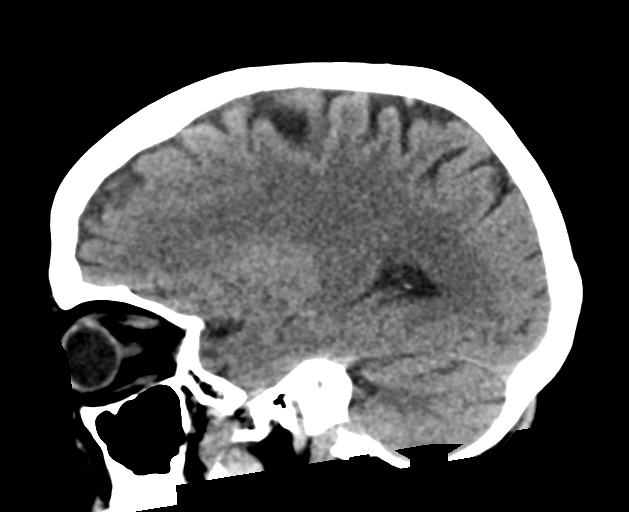
[im 26/52  brain]
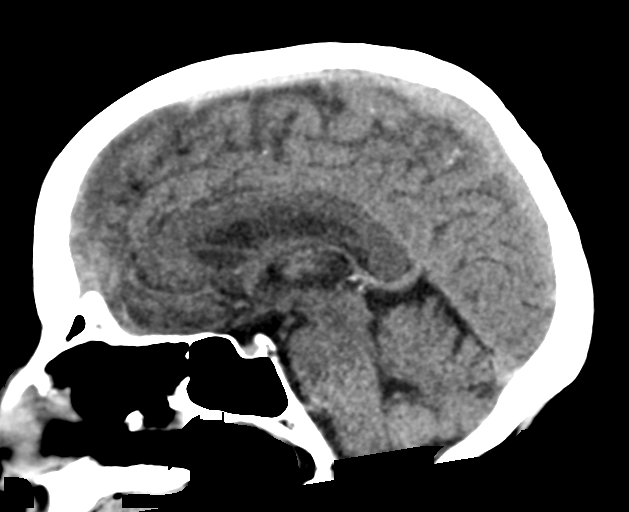
[im 35/52  brain]
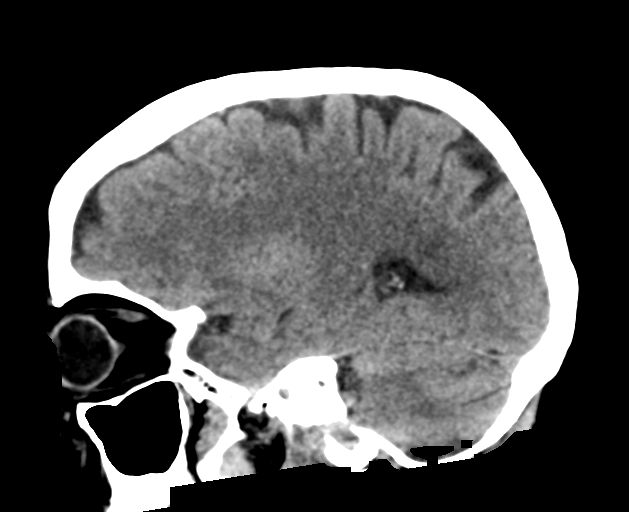

[13 of 47 positions shown; findings below may reference images not displayed]

FINDINGS: CT HEAD FINDINGS

Brain: Interval resolution noted of the previous small left frontal
parenchymal hematoma and left frontal subarachnoid bleed. Small para
foramen tentorial subdural bleed has also resolved. Minimal anterior
falcine bleed is also resolved. There is mild atrophy and small
vessel disease without evidence of cortical based infarct,
hemorrhage, mass or mass effect, ventriculomegaly or midline shift.

Vascular: There calcifications of the carotid siphons and distal
vertebral arteries but no hyperdense central vessels.

Skull: AP oriented nondisplaced right occipital bone skull fracture
again seen with extension to the right jugular foramen at the skull
base. It is unchanged in appearance. There is no new fracture.
Osteopenia.

Other: None.

CT MAXILLOFACIAL FINDINGS

Osseous: There is a slightly depressed fracture of the tip of the
nasal bone anteriorly, and a comminuted mildly depressed fracture of
the right side of the nasal bone. There are no further appreciable
facial fractures, including the sinus walls, mandible and teeth.
Mild osteopenia. Nondisplaced longitudinal right occipital skull
fracture is again noted but only partially visible , not grossly
changed in appearance since prior study.

Orbits: The orbits and bilateral orbital contents are intact. Old
lens extractions are again noted.

Sinuses: There is mild membrane thickening in the paranasal sinuses
without fluid level nor mastoid effusion. The middle ear cavities
are clear. The nasal septum is intact and is S shaped with left
septal spurring remodeling neck of the left middle turbinate. Both
of the ostiomeatal complexes are patent.

Soft tissues: There is soft tissue swelling overlying the nose,
greater on the right. There is edema in the right cheek likely also
traumatic etiology. There is mild swelling in the right preorbital
soft tissues but no intraorbital edema or fluid collection. Both
parotid glands are fatty replaced. The epiglottis and tongue base
are normal and the tonsils are not enlarged.

CT CERVICAL SPINE FINDINGS

Alignment: There is minimal grade 1 C7-T1 anterolisthesis which was
seen previously likely degenerative. Alignment is otherwise normal.

Skull base and vertebrae: Nondisplaced right occipital bone skull
fracture is again noted and unchanged in appearance. There is
osteopenia without evidence of cervical fracture.

Soft tissues and spinal canal: No prevertebral fluid or swelling. No
visible canal hematoma.

Disc levels: The discs are mildly degenerated at C2-3 and C7-T1 ,
moderately degenerated at C4-5 and C5-6 and there bidirectional
osteophytes from C3-4 through C6-7, mildly encroaching on the
ventral cord surface causing mild spinal canal stenosis.

There is bone-on-bone joint space loss and moderate osteophytosis at
the anterior atlantodental joint. There are facet and uncinate
osteophytes at most levels, with foraminal stenosis which is
moderate on the left at C2-3, moderate to severe bilaterally at
C3-4, bilaterally moderate C4-5, moderate to severe on the right at
C5-6, mild on the left and moderate on the right at C6-7.

Upper chest: Negative.

Other: There are moderate bilateral calcifications of the carotid
bifurcations. No thyroid mass.
IMPRESSION: 1. No acute intracranial CT findings with interval resolution of the
bilateral intracranial bleeds described previously.
2. Right occipital skull fracture is again noted, nondisplaced with
inferior aspect extending through the right jugular foramen at the
skull base.
3. Slightly depressed fracture of the midline nasal bone tip and
mildly displaced comminution fracture of the right side of the nasal
bone. Areas of facial soft tissue swelling without further visible
facial fracture.
4. Sinus membrane disease, with S shaped nasal septum.
5. Osteopenia and degenerative change of the cervical spine without
evidence of fractures or traumatic listhesis. Chronic degenerative
grade 1 listhesis at C7-T1.

## 2021-09-21 MED ORDER — CEPHALEXIN 500 MG PO CAPS: 500.0000 mg | ORAL_CAPSULE | Freq: Two times a day (BID) | ORAL | 0 refills | Status: AC

## 2021-09-21 NOTE — ED Notes (Signed)
RN attempted to start IV, unsuccessful. 

## 2021-09-21 NOTE — Discharge Instructions (Addendum)
Recommend soft foods or soft liquids only for the next few days.  Follow-up with your primary doctor within the next few days for close recheck.  Also please follow-up with your dentist.

## 2021-09-21 NOTE — ED Notes (Signed)
Pt verbalized understanding of d/c instructions, meds and followup care. Denies questions. VSS, no distress noted. W/C to exit with all belongings.  

## 2021-09-21 NOTE — ED Provider Notes (Signed)
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  Patient transferred over from [facility] for CT scan as there CT scan was not working.  Patient had recent fall and had subarachnoid hemorrhage traumatic and skull fracture for which she was admitted for.  Patient's been doing overall well until another fall happened last night.  On exam patient has tenderness and mild deformity to nasal bone without hematoma or bleeding.  Patient has no midline cervical tenderness.  CT scan results reviewed showing no intracranial bleeding, stable skull fracture and new nasal bone fracture.  Patient stable for outpatient follow-up.   Blane Ohara, MD 09/21/21 [phone]

## 2022-09-22 DEATH — deceased
